# Patient Record
Sex: Female | Born: 1965
Health system: Southern US, Community
[De-identification: ages and names within clinical notes are randomized; demographics above are authoritative.]

## PROBLEM LIST (undated history)

## (undated) DIAGNOSIS — M1990 Unspecified osteoarthritis, unspecified site: Secondary | ICD-10-CM

## (undated) DIAGNOSIS — F32A Depression, unspecified: Secondary | ICD-10-CM

## (undated) DIAGNOSIS — E785 Hyperlipidemia, unspecified: Secondary | ICD-10-CM

## (undated) DIAGNOSIS — R87629 Unspecified abnormal cytological findings in specimens from vagina: Secondary | ICD-10-CM

## (undated) DIAGNOSIS — R519 Headache, unspecified: Secondary | ICD-10-CM

## (undated) DIAGNOSIS — F329 Major depressive disorder, single episode, unspecified: Secondary | ICD-10-CM

## (undated) DIAGNOSIS — R51 Headache: Secondary | ICD-10-CM

## (undated) DIAGNOSIS — T7840XA Allergy, unspecified, initial encounter: Secondary | ICD-10-CM

## (undated) DIAGNOSIS — E079 Disorder of thyroid, unspecified: Secondary | ICD-10-CM

## (undated) HISTORY — DX: Headache, unspecified: R51.9

## (undated) HISTORY — PX: WISDOM TOOTH EXTRACTION: SHX21

## (undated) HISTORY — DX: Hyperlipidemia, unspecified: E78.5

## (undated) HISTORY — DX: Unspecified osteoarthritis, unspecified site: M19.90

## (undated) HISTORY — DX: Disorder of thyroid, unspecified: E07.9

## (undated) HISTORY — DX: Allergy, unspecified, initial encounter: T78.40XA

## (undated) HISTORY — DX: Headache: R51

## (undated) HISTORY — PX: APPENDECTOMY: SHX54

## (undated) HISTORY — DX: Depression, unspecified: F32.A

## (undated) HISTORY — DX: Unspecified abnormal cytological findings in specimens from vagina: R87.629

## (undated) HISTORY — DX: Major depressive disorder, single episode, unspecified: F32.9

---

## 2016-01-21 DIAGNOSIS — F321 Major depressive disorder, single episode, moderate: Secondary | ICD-10-CM | POA: Insufficient documentation

## 2016-01-27 DIAGNOSIS — Z9189 Other specified personal risk factors, not elsewhere classified: Secondary | ICD-10-CM | POA: Insufficient documentation

## 2016-01-31 DIAGNOSIS — Z8639 Personal history of other endocrine, nutritional and metabolic disease: Secondary | ICD-10-CM | POA: Insufficient documentation

## 2017-03-20 DIAGNOSIS — E669 Obesity, unspecified: Secondary | ICD-10-CM | POA: Insufficient documentation

## 2017-03-20 DIAGNOSIS — E559 Vitamin D deficiency, unspecified: Secondary | ICD-10-CM | POA: Insufficient documentation

## 2017-03-20 DIAGNOSIS — R3589 Other polyuria: Secondary | ICD-10-CM | POA: Insufficient documentation

## 2017-06-14 LAB — HM MAMMOGRAPHY: HM Mammogram: NORMAL (ref 0–4)

## 2017-06-22 LAB — HM PAP SMEAR

## 2017-12-13 DIAGNOSIS — D229 Melanocytic nevi, unspecified: Secondary | ICD-10-CM | POA: Insufficient documentation

## 2018-05-08 ENCOUNTER — Encounter: Payer: Self-pay | Admitting: Family Medicine

## 2018-05-08 ENCOUNTER — Ambulatory Visit (INDEPENDENT_AMBULATORY_CARE_PROVIDER_SITE_OTHER): Payer: No Typology Code available for payment source | Admitting: Family Medicine

## 2018-05-08 VITALS — BP 106/76 | HR 81 | Temp 98.5°F | Resp 16 | Ht 67.25 in | Wt 198.0 lb

## 2018-05-08 DIAGNOSIS — M25542 Pain in joints of left hand: Secondary | ICD-10-CM

## 2018-05-08 DIAGNOSIS — Z23 Encounter for immunization: Secondary | ICD-10-CM | POA: Diagnosis not present

## 2018-05-08 DIAGNOSIS — E039 Hypothyroidism, unspecified: Secondary | ICD-10-CM

## 2018-05-08 DIAGNOSIS — Z114 Encounter for screening for human immunodeficiency virus [HIV]: Secondary | ICD-10-CM

## 2018-05-08 DIAGNOSIS — M25541 Pain in joints of right hand: Secondary | ICD-10-CM | POA: Diagnosis not present

## 2018-05-08 DIAGNOSIS — Z1211 Encounter for screening for malignant neoplasm of colon: Secondary | ICD-10-CM | POA: Diagnosis not present

## 2018-05-08 DIAGNOSIS — Z0001 Encounter for general adult medical examination with abnormal findings: Secondary | ICD-10-CM

## 2018-05-08 DIAGNOSIS — Z0000 Encounter for general adult medical examination without abnormal findings: Secondary | ICD-10-CM | POA: Diagnosis not present

## 2018-05-08 MED ORDER — MELOXICAM 15 MG PO TABS: 7.5000 mg | ORAL_TABLET | Freq: Every day | ORAL | 5 refills | Status: DC

## 2018-05-08 MED ORDER — LEVOTHYROXINE SODIUM 150 MCG PO TABS: 150.0000 ug | ORAL_TABLET | Freq: Every day | ORAL | 3 refills | Status: DC

## 2018-05-08 NOTE — Patient Instructions (Addendum)
Aim to do some physical exertion for 150 minutes per week. This is typically divided into 5 days per week, 30 minutes per day. The activity should be enough to get your heart rate up. Anything is better than nothing if you have time constraints.  Give [place] 2-3 business days to get the results of your labs back.   The new Shingrix vaccine (for shingles) is a 2 shot series. It can make people feel low energy, achy and almost like they have the flu for 48 hours after injection. Please plan accordingly when deciding on when to get this shot. Call our office for a nurse visit appointment to get this. The second shot of the series is less severe regarding the side effects, but it still lasts 48 hours.   Healthy Eating Plan Many factors influence your heart health, including eating and exercise habits. Heart (coronary) risk increases with abnormal blood fat (lipid) levels. Heart-healthy meal planning includes limiting unhealthy fats, increasing healthy fats, and making other small dietary changes. This includes maintaining a healthy body weight to help keep lipid levels within a normal range.  WHAT IS MY PLAN?  Your health care provider recommends that you:  Drink a glass of water before meals to help with satiety.  Eat slowly.  An alternative to the water is to add Metamucil. This will help with satiety as well. It does contain calories, unlike water.  WHAT TYPES OF FAT SHOULD I CHOOSE?  Choose healthy fats more often. Choose monounsaturated and polyunsaturated fats, such as olive oil and canola oil, flaxseeds, walnuts, almonds, and seeds.  Eat more omega-3 fats. Good choices include salmon, mackerel, sardines, tuna, flaxseed oil, and ground flaxseeds. Aim to eat fish at least two times each week.  Avoid foods with partially hydrogenated oils in them. These contain trans fats. Examples of foods that contain trans fats are stick margarine, some tub margarines, cookies, crackers, and other baked goods.  If you are going to avoid a fat, this is the one to avoid!  WHAT GENERAL GUIDELINES DO I NEED TO FOLLOW?  Check food labels carefully to identify foods with trans fats. Avoid these types of options when possible.  Fill one half of your plate with vegetables and green salads. Eat 4-5 servings of vegetables per day. A serving of vegetables equals 1 cup of raw leafy vegetables,  cup of raw or cooked cut-up vegetables, or  cup of vegetable juice.  Fill one fourth of your plate with whole grains. Look for the word "whole" as the first word in the ingredient list.  Fill one fourth of your plate with lean protein foods.  Eat 4-5 servings of fruit per day. A serving of fruit equals one medium whole fruit,  cup of dried fruit,  cup of fresh, frozen, or canned fruit. Try to avoid fruits in cups/syrups as the sugar content can be high.  Eat more foods that contain soluble fiber. Examples of foods that contain this type of fiber are apples, broccoli, carrots, beans, peas, and barley. Aim to get 20-30 g of fiber per day.  Eat more home-cooked food and less restaurant, buffet, and fast food.  Limit or avoid alcohol.  Limit foods that are high in starch and sugar.  Avoid fried foods when able.  Cook foods by using methods other than frying. Baking, boiling, grilling, and broiling are all great options. Other fat-reducing suggestions include: ? Removing the skin from poultry. ? Removing all visible fats from meats. ? Skimming the  fat off of stews, soups, and gravies before serving them. ? Steaming vegetables in water or broth.  Lose weight if you are overweight. Losing just 5-10% of your initial body weight can help your overall health and prevent diseases such as diabetes and heart disease.  Increase your consumption of nuts, legumes, and seeds to 4-5 servings per week. One serving of dried beans or legumes equals  cup after being cooked, one serving of nuts equals 1 ounces, and one serving of  seeds equals  ounce or 1 tablespoon.  WHAT ARE GOOD FOODS CAN I EAT? Grains Grainy breads (try to find bread that is 3 g of fiber per slice or greater), oatmeal, light popcorn. Whole-grain cereals. Rice and pasta, including brown rice and those that are made with whole wheat. Edamame pasta is a great alternative to grain pasta. It has a higher protein content. Try to avoid significant consumption of white bread, sugary cereals, or pastries/baked goods.  Vegetables All vegetables. Cooked white potatoes do not count as vegetables.  Fruits All fruits, but limit pineapple and bananas as these fruits have a higher sugar content.  Meats and Other Protein Sources Lean, well-trimmed beef, veal, pork, and lamb. Chicken and [place] without skin. All fish and shellfish. Wild duck, rabbit, pheasant, and venison. Egg whites or low-cholesterol egg substitutes. Dried beans, peas, lentils, and tofu.Seeds and most nuts.  Dairy Low-fat or nonfat cheeses, including ricotta, string, and mozzarella. Skim or 1% milk that is liquid, powdered, or evaporated. Buttermilk that is made with low-fat milk. Nonfat or low-fat yogurt. Soy/Almond milk are good alternatives if you cannot handle dairy.  Beverages Water is the best for you. Sports drinks with less sugar are more desirable unless you are a highly active athlete.  Sweets and Desserts Sherbets and fruit ices. Honey, jam, marmalade, jelly, and syrups. Dark chocolate.  Eat all sweets and desserts in moderation.  Fats and Oils Nonhydrogenated (trans-free) margarines. Vegetable oils, including soybean, sesame, sunflower, olive, peanut, safflower, corn, canola, and cottonseed. Salad dressings or mayonnaise that are made with a vegetable oil. Limit added fats and oils that you use for cooking, baking, salads, and as spreads.  Other Cocoa powder. Coffee and tea. Most condiments.  The items listed above may not be a complete list of recommended foods or beverages.  Contact your dietitian for more options.

## 2018-05-08 NOTE — Progress Notes (Signed)
CC: WC   Well Woman Sharon Bradford is here for a complete physical.   Her last physical was >1 year ago.  Current diet: in general, a "healthy" diet. Current exercise: walking dogs. Weight is stable and she denies daytime fatigue. No LMP recorded. Seatbelt? Yes  Patient has a family history of rheumatoid arthritis.  She has been having worsening pain in the PIP joints of both hands.  She is concerned she may have inflammatory arthritis and would like to be checked.  She does have a questionable history of Graves' disease.  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  Health Maintenance Pap/HPV- Yes 06/22/17 Mammogram- Yes  06/14/18 Tetanus- No Colonoscopy- No HIV screening- No  Flu- 05/03/18  Past Medical History:  Diagnosis Date  . Arthritis   . Depression   . Headache      Past Surgical History:  Procedure Laterality Date  . APPENDECTOMY      Medications  Current Outpatient Medications on File Prior to Visit  Medication Sig Dispense Refill  . Cholecalciferol (VITAMIN D3) 2000 units TABS Take 2,000 Units by mouth daily.    Marland Kitchen Loratadine-Pseudoephedrine (PX ALLERGY RELIEF D, LORATID, PO) Take 10 mg by mouth daily.      Allergies No Known Allergies  Review of Systems: Constitutional:  no unexpected weight changes Eye:  no recent significant change in vision Ear/Nose/Mouth/Throat:  Ears:  no tinnitus or vertigo and no recent change in hearing Nose/Mouth/Throat:  no complaints of nasal congestion, no sore throat Cardiovascular: no chest pain Respiratory:  no cough and no shortness of breath Gastrointestinal:  no abdominal pain, no change in bowel habits GU:  Female: negative for dysuria or pelvic pain Musculoskeletal/Extremities: +hand pain; otherwise no pain of the joints Integumentary (Skin/Breast):  no abnormal skin lesions reported Neurologic:  no headaches Endocrine:  denies fatigue Hematologic/Lymphatic:  No areas of easy bleeding  Exam BP  106/76   Pulse 81   Temp 98.5 F (36.9 C) (Oral)   Resp 16   Ht 5' 7.25" (1.708 m)   Wt 198 lb (89.8 kg)   SpO2 98%   BMI 30.78 kg/m  General:  well developed, well nourished, in no apparent distress Skin:  no significant moles, warts, or growths Head:  no masses, lesions, or tenderness Eyes:  pupils equal and round, sclera anicteric without injection Ears:  canals without lesions, TMs shiny without retraction, no obvious effusion, no erythema Nose:  nares patent, septum midline, mucosa normal, and no drainage or sinus tenderness Throat/Pharynx:  lips and gingiva without lesion; tongue and uvula midline; non-inflamed pharynx; no exudates or postnasal drainage Neck: neck supple without adenopathy, thyromegaly, or masses Lungs:  clear to auscultation, breath sounds equal bilaterally, no respiratory distress Cardio:  regular rate and rhythm, no bruits, no LE edema Abdomen:  abdomen soft, nontender; bowel sounds normal; no masses or organomegaly Genital: Defer to GYN Musculoskeletal: +thickening of joints of PIP's b/l hands; otherwise symmetrical muscle groups noted without atrophy or deformity Extremities:  no clubbing, cyanosis, or edema, no deformities, no skin discoloration Neuro:  gait normal; deep tendon reflexes normal and symmetric Psych: well oriented with normal range of affect and appropriate judgment/insight  Assessment and Plan  Well adult exam - Plan: Lipid panel, CBC, Comprehensive metabolic panel  Screen for colon cancer - Plan: Ambulatory referral to Gastroenterology  Encounter for screening for HIV - Plan: HIV Antibody (routine testing w rflx)  Hypothyroidism, unspecified type - Plan: levothyroxine (SYNTHROID, LEVOTHROID) 150 MCG  tablet, TSH, T4, free  Arthralgia of both hands - Plan: ANA,IFA RA Diag Pnl w/rflx Tit/Patn, Anti-DNA antibody, double-stranded, Anti-Maffei antibody  Need for Tdap vaccination - Plan: Tdap vaccine greater than or equal to 12yo IM   Well  52 y.o. female. Counseled on diet and exercise. Other orders as above. Follow up in 1 yr pending above. The patient voiced understanding and agreement to the plan.  [place], DO 05/08/18 5:05 PM

## 2018-05-09 ENCOUNTER — Encounter: Payer: Self-pay | Admitting: Gastroenterology

## 2018-05-09 LAB — CBC
HEMATOCRIT: 34.5 % — AB (ref 36.0–46.0)
Hemoglobin: 11.7 g/dL — ABNORMAL LOW (ref 12.0–15.0)
MCHC: 34 g/dL (ref 30.0–36.0)
MCV: 87.4 fl (ref 78.0–100.0)
Platelets: 199 10*3/uL (ref 150.0–400.0)
RBC: 3.95 Mil/uL (ref 3.87–5.11)
RDW: 13.7 % (ref 11.5–15.5)
WBC: 6.4 10*3/uL (ref 4.0–10.5)

## 2018-05-09 LAB — LIPID PANEL
CHOLESTEROL: 205 mg/dL — AB (ref 0–200)
HDL: 38.7 mg/dL — ABNORMAL LOW (ref >39.00)
Total CHOL/HDL Ratio: 5

## 2018-05-09 LAB — T4, FREE: FREE T4: 1.12 ng/dL (ref 0.60–1.60)

## 2018-05-09 LAB — COMPREHENSIVE METABOLIC PANEL
ALBUMIN: 4.2 g/dL (ref 3.5–5.2)
ALT: 16 U/L (ref 0–35)
AST: 14 U/L (ref 0–37)
Alkaline Phosphatase: 47 U/L (ref 39–117)
BUN: 20 mg/dL (ref 6–23)
CHLORIDE: 104 meq/L (ref 96–112)
CO2: 25 meq/L (ref 19–32)
CREATININE: 0.81 mg/dL (ref 0.40–1.20)
Calcium: 9.3 mg/dL (ref 8.4–10.5)
GFR: 79.31 mL/min (ref >60.00)
Glucose, Bld: 90 mg/dL (ref 70–99)
POTASSIUM: 4.3 meq/L (ref 3.5–5.1)
SODIUM: 138 meq/L (ref 135–145)
Total Bilirubin: 0.8 mg/dL (ref 0.2–1.2)
Total Protein: 6.8 g/dL (ref 6.0–8.3)

## 2018-05-09 LAB — LDL CHOLESTEROL, DIRECT: LDL DIRECT: 101 mg/dL

## 2018-05-09 LAB — TSH: TSH: 0.46 u[IU]/mL (ref 0.35–4.50)

## 2018-05-09 MED FILL — LEVOTHYROXINE 150 MCG TAB: 150 | 90 days supply | Qty: 90 | Fill #0

## 2018-05-09 MED FILL — MELOXICAM 15 MG TABLET: 15 | 30 days supply | Qty: 30 | Fill #0

## 2018-05-10 LAB — ANA,IFA RA DIAG PNL W/RFLX TIT/PATN
Anti Nuclear Antibody(ANA): NEGATIVE
Rhuematoid fact SerPl-aCnc: <14 IU/mL (ref <14)

## 2018-05-10 LAB — ANTI-SMITH ANTIBODY: ENA SM AB SER-ACNC: NEGATIVE AI

## 2018-05-10 LAB — ANTI-DNA ANTIBODY, DOUBLE-STRANDED: DS DNA AB: 5 [IU]/mL — AB

## 2018-05-10 LAB — HIV ANTIBODY (ROUTINE TESTING W REFLEX): HIV 1&2 Ab, 4th Generation: NONREACTIVE

## 2018-05-31 ENCOUNTER — Ambulatory Visit (AMBULATORY_SURGERY_CENTER): Payer: Self-pay | Admitting: *Deleted

## 2018-05-31 VITALS — Ht 67.0 in | Wt 196.0 lb

## 2018-05-31 DIAGNOSIS — Z1211 Encounter for screening for malignant neoplasm of colon: Secondary | ICD-10-CM

## 2018-05-31 MED ORDER — NA SULFATE-K SULFATE-MG SULF 17.5-3.13-1.6 GM/177ML PO SOLN: 1.0000 | Freq: Once | ORAL | 0 refills | Status: AC

## 2018-05-31 MED FILL — SUPREP BOWEL PREP KIT: 17.5-3.13-1 | 1 days supply | Qty: 354 | Fill #0

## 2018-05-31 NOTE — Progress Notes (Signed)
No egg or soy allergy known to patient  No issues with past sedation with any surgeries  or procedures, no intubation problems  No diet pills per patient No home 02 use per patient  No blood thinners per patient  Pt denies issues with constipation  No A fib or A flutter  EMMI video sent to pt's e mail pt declined   

## 2018-06-06 ENCOUNTER — Encounter: Payer: Self-pay | Admitting: Gastroenterology

## 2018-06-20 ENCOUNTER — Ambulatory Visit (AMBULATORY_SURGERY_CENTER): Payer: No Typology Code available for payment source | Admitting: Gastroenterology

## 2018-06-20 ENCOUNTER — Encounter: Payer: Self-pay | Admitting: Gastroenterology

## 2018-06-20 VITALS — BP 117/72 | HR 55 | Temp 97.8°F | Resp 16 | Ht 67.0 in | Wt 196.0 lb

## 2018-06-20 DIAGNOSIS — Z1211 Encounter for screening for malignant neoplasm of colon: Secondary | ICD-10-CM

## 2018-06-20 DIAGNOSIS — D127 Benign neoplasm of rectosigmoid junction: Secondary | ICD-10-CM

## 2018-06-20 MED ORDER — SODIUM CHLORIDE 0.9 % IV SOLN: 500.0000 mL | Freq: Once | INTRAVENOUS | Status: DC

## 2018-06-20 NOTE — Progress Notes (Signed)
Called to room to assist during endoscopic procedure.  Patient ID and intended procedure confirmed with present staff. Received instructions for my participation in the procedure from the performing physician.  

## 2018-06-20 NOTE — Progress Notes (Signed)
To PACU, VSS. Report to [email or web address].tb 

## 2018-06-20 NOTE — Op Note (Signed)
Sharon Bradford: Kevin Mario Procedure Date: 06/20/2018 9:36 AM MRN: [id number] Endoscopist: Jackquline Denmark , MD Age: 52 Referring MD:  Date of Birth: Dec 10, 1965 Gender: Female Account #: [id number] Procedure:                Colonoscopy Indications:              Screening for colorectal malignant neoplasm Medicines:                Monitored Anesthesia Care Procedure:                Pre-Anesthesia Assessment:                           - Prior to the procedure, a History and Physical                            was performed, and patient medications and                            allergies were reviewed. The patient's tolerance of                            previous anesthesia was also reviewed. The risks                            and benefits of the procedure and the sedation                            options and risks were discussed with the patient.                            All questions were answered, and informed consent                            was obtained. Prior Anticoagulants: The patient has                            taken no previous anticoagulant or antiplatelet                            agents. ASA Grade Assessment: I - A normal, healthy                            patient. After reviewing the risks and benefits,                            the patient was deemed in satisfactory condition to                            undergo the procedure.                           After obtaining informed consent, the colonoscope  was passed under direct vision. Throughout the                            procedure, the patient's blood pressure, pulse, and                            oxygen saturations were monitored continuously. The                            Colonoscope was introduced through the anus and                            advanced to the 2 cm into the ileum. The                            colonoscopy was performed without  difficulty. The                            patient tolerated the procedure well. The quality                            of the bowel preparation was excellent. The colon                            was highly redundant. Scope In: 9:44:38 AM Scope Out: 10:00:03 AM Scope Withdrawal Time: 0 hours 9 minutes 52 seconds  Total Procedure Duration: 0 hours 15 minutes 25 seconds  Findings:                 A 10 mm polyp was found in the recto-sigmoid colon,                            15 cm from the anal verge. The polyp was                            pedunculated. The polyp was removed with a hot                            snare. Resection and retrieval were complete.                            Estimated blood loss: none.                           Non-bleeding internal hemorrhoids were found during                            retroflexion. The hemorrhoids were small.                           The exam was otherwise without abnormality on                            direct and retroflexion views. Complications:  No immediate complications. Estimated Blood Loss:     Estimated blood loss: none. Impression:               -Colonic polyp status post polypectomy                           -Small internal hemorrhoids.                           -Otherwise normal colonoscopy to TI. Recommendation:           - Patient has a contact number available for                            emergencies. The signs and symptoms of potential                            delayed complications were discussed with the                            patient. Return to normal activities tomorrow.                            Written discharge instructions were provided to the                            patient.                           - Resume previous diet.                           - Continue present medications.                           - Await pathology results.                           - Repeat colonoscopy for  surveillance based on                            pathology results. Jackquline Denmark, MD 06/20/2018 10:05:06 AM This report has been signed electronically.

## 2018-06-20 NOTE — Patient Instructions (Signed)
Polyp information given.   YOU HAD AN ENDOSCOPIC PROCEDURE TODAY AT THE [facility]:   Refer to the procedure report that was given to you for any specific questions about what was found during the examination.  If the procedure report does not answer your questions, please call your gastroenterologist to clarify.  If you requested that your care partner not be given the details of your procedure findings, then the procedure report has been included in a sealed envelope for you to review at your convenience later.  YOU SHOULD EXPECT: Some feelings of bloating in the abdomen. Passage of more gas than usual.  Walking can help get rid of the air that was put into your GI tract during the procedure and reduce the bloating. If you had a lower endoscopy (such as a colonoscopy or flexible sigmoidoscopy) you may notice spotting of blood in your stool or on the toilet paper. If you underwent a bowel prep for your procedure, you may not have a normal bowel movement for a few days.  Please Note:  You might notice some irritation and congestion in your nose or some drainage.  This is from the oxygen used during your procedure.  There is no need for concern and it should clear up in a day or so.  SYMPTOMS TO REPORT IMMEDIATELY:   Following lower endoscopy (colonoscopy or flexible sigmoidoscopy):  Excessive amounts of blood in the stool  Significant tenderness or worsening of abdominal pains  Swelling of the abdomen that is new, acute  Fever of 100F or higher   For urgent or emergent issues, a gastroenterologist can be reached at any hour by calling [phone].   DIET:  We do recommend a small meal at first, but then you may proceed to your regular diet.  Drink plenty of fluids but you should avoid alcoholic beverages for 24 hours.  ACTIVITY:  You should plan to take it easy for the rest of today and you should NOT DRIVE or use heavy machinery until tomorrow (because of the sedation  medicines used during the test).    FOLLOW UP: Our staff will call the number listed on your records the next business day following your procedure to check on you and address any questions or concerns that you may have regarding the information given to you following your procedure. If we do not reach you, we will leave a message.  However, if you are feeling well and you are not experiencing any problems, there is no need to return our call.  We will assume that you have returned to your regular daily activities without incident.  If any biopsies were taken you will be contacted by phone or by letter within the next 1-3 weeks.  Please call us at [phone] if you have not heard about the biopsies in 3 weeks.    SIGNATURES/CONFIDENTIALITY: You and/or your care partner have signed paperwork which will be entered into your electronic medical record.  These signatures attest to the fact that that the information above on your After Visit Summary has been reviewed and is understood.  Full responsibility of the confidentiality of this discharge information lies with you and/or your care-partner. 

## 2018-06-21 ENCOUNTER — Telehealth: Payer: Self-pay

## 2018-06-21 NOTE — Telephone Encounter (Signed)
  Follow up Call-  Call back number 06/20/2018  Post procedure Call Back phone  # [phone] - please call after 8:30am  Permission to leave phone message Yes     Patient questions:  Do you have a fever, pain , or abdominal swelling? No. Pain Score  0 *  Have you tolerated food without any problems? Yes.    Have you been able to return to your normal activities? Yes.    Do you have any questions about your discharge instructions: Diet   No. Medications  No. Follow up visit  No.  Do you have questions or concerns about your Care? No.  Actions: * If pain score is 4 or above: No action needed, pain <4.

## 2018-06-29 ENCOUNTER — Encounter: Payer: Self-pay | Admitting: Gastroenterology

## 2018-07-10 ENCOUNTER — Telehealth: Payer: Self-pay | Admitting: Family Medicine

## 2018-07-10 NOTE — Telephone Encounter (Signed)
Copied from [place] [id number]. Topic: Referral - Request for Referral >> Jul 10, 2018  4:32 PM Blase Mess A wrote: Has patient seen PCP for this complaint? No-Yes Insurance is requiring *If NO, is insurance requiring patient see PCP for this issue before PCP can refer them? Referral for which specialty-GYN Preferred provider/office: [facility] Reason for referral: Insurance-[employer] ok calling to schedule appt

## 2018-07-10 NOTE — Telephone Encounter (Signed)
Two messages were sent on this patient and another(that was not our patient), with the exact same message and exact time. Please clarify which patient this message actually belongs to.

## 2018-07-23 MED FILL — LEVOTHYROXINE 150 MCG TAB: 150 | 90 days supply | Qty: 90 | Fill #1

## 2018-07-23 MED FILL — MELOXICAM 15 MG TABLET: 15 | 30 days supply | Qty: 30 | Fill #1

## 2018-09-04 ENCOUNTER — Encounter: Payer: Self-pay | Admitting: Family Medicine

## 2018-09-04 ENCOUNTER — Ambulatory Visit (INDEPENDENT_AMBULATORY_CARE_PROVIDER_SITE_OTHER): Payer: No Typology Code available for payment source | Admitting: Family Medicine

## 2018-09-04 VITALS — BP 112/78 | HR 77 | Temp 98.5°F | Ht 67.5 in | Wt 195.5 lb

## 2018-09-04 DIAGNOSIS — R110 Nausea: Secondary | ICD-10-CM | POA: Diagnosis not present

## 2018-09-04 DIAGNOSIS — R109 Unspecified abdominal pain: Secondary | ICD-10-CM | POA: Diagnosis not present

## 2018-09-04 DIAGNOSIS — N390 Urinary tract infection, site not specified: Secondary | ICD-10-CM | POA: Diagnosis not present

## 2018-09-04 LAB — POCT INFLUENZA A/B
INFLUENZA B, POC: NEGATIVE
Influenza A, POC: NEGATIVE

## 2018-09-04 LAB — POC URINALSYSI DIPSTICK (AUTOMATED)
Bilirubin, UA: NEGATIVE
GLUCOSE UA: NEGATIVE
Ketones, UA: NEGATIVE
LEUKOCYTES UA: NEGATIVE
NITRITE UA: NEGATIVE
PH UA: 6 (ref 5.0–8.0)
Protein, UA: NEGATIVE
RBC UA: NEGATIVE
Spec Grav, UA: 1.015 (ref 1.010–1.025)
UROBILINOGEN UA: 0.2 U/dL

## 2018-09-04 MED ORDER — DICYCLOMINE HCL 10 MG PO CAPS: 10.0000 mg | ORAL_CAPSULE | Freq: Three times a day (TID) | ORAL | 0 refills | Status: DC

## 2018-09-04 MED ORDER — CEFTRIAXONE SODIUM 1 G IJ SOLR
1.0000 g | Freq: Once | INTRAMUSCULAR | Status: AC
  Administered 2018-09-04: 1 g via INTRAMUSCULAR

## 2018-09-04 MED ORDER — SULFAMETHOXAZOLE-TRIMETHOPRIM 800-160 MG PO TABS: 1.0000 | ORAL_TABLET | Freq: Two times a day (BID) | ORAL | 0 refills | Status: AC

## 2018-09-04 MED ORDER — ONDANSETRON HCL 4 MG PO TABS: 4.0000 mg | ORAL_TABLET | Freq: Three times a day (TID) | ORAL | 0 refills | Status: DC | PRN

## 2018-09-04 MED FILL — SULFAMETHOXAZOLE-TMP DS TAB: 800-160 | 7 days supply | Qty: 14 | Fill #0

## 2018-09-04 MED FILL — DICYCLOMINE 10 MG CAPSULE: 10 | 7 days supply | Qty: 30 | Fill #0

## 2018-09-04 MED FILL — ONDANSETRON HCL 4 MG TABLET: 4 | 7 days supply | Qty: 20 | Fill #0

## 2018-09-04 NOTE — Progress Notes (Signed)
Chief Complaint  Patient presents with  . Diarrhea  . Nausea  . Generalized Body Aches     Subjective Sharon Bradford is a 53 y.o. female who presents with vomiting and diarrhea Symptoms began yesterday Patient has cramping, myalgias, mild cough, nasal congestion/pressure, burning with urination and nausea Patient denies vomiting, diarrhea and fever Evaluation to date: Phenergan, Baclofen Sick contacts: grandson has GI illness No recent abx use.   Past Medical History:  Diagnosis Date  . Allergy   . Arthritis   . Depression   . Headache   . Hyperlipidemia    little elevated but no medicines   . Thyroid disease    Past Surgical History:  Procedure Laterality Date  . APPENDECTOMY    . WISDOM TOOTH EXTRACTION     no sedation    Current Outpatient Medications on File Prior to Visit  Medication Sig Dispense Refill  . Cholecalciferol (VITAMIN D3) 2000 units TABS Take 2,000 Units by mouth daily.    Marland Kitchen levothyroxine (SYNTHROID, LEVOTHROID) 150 MCG tablet Take 1 tablet (150 mcg total) by mouth daily before breakfast. 90 tablet 3  . Loratadine-Pseudoephedrine (PX ALLERGY RELIEF D, LORATID, PO) Take 10 mg by mouth daily.    . meloxicam (MOBIC) 15 MG tablet Take 0.5-1 tablets (7.5-15 mg total) by mouth daily. 30 tablet 5   Allergies  Allergen Reactions  . Codeine Itching    Review of Systems Constitutional:  No fevers or chills Ear/Nose/Mouth/Throat:  No red eyes Gastrointestinal:  As noted in the HPI Integumentary (Skin/Breast): no rash  Exam BP 112/78 (BP Location: Left Arm, Patient Position: Sitting, Cuff Size: Normal)   Pulse 77   Temp 98.5 F (36.9 C) (Oral)   Ht 5' 7.5" (1.715 m)   Wt 195 lb 8 oz (88.7 kg)   SpO2 98%   BMI 30.17 kg/m  General:  well developed, well hydrated, in no apparent distress HEENT: Ears neg, nares neg, no sinus ttp Skin:  warm, no pallor or diaphoresis, no rashes Throat/Pharynx:  lips and gingiva without lesion; tongue and uvula  midline; non-inflamed pharynx; no exudates or postnasal drainage Neck: neck supple without adenopathy, thyromegaly, or masses Lungs:  clear to auscultation, breath sounds equal bilaterally, no respiratory distress, no wheezes Cardio:  regular rate and rhythm without murmurs Abdomen:  abdomen soft, ttp diffusely, worse in lower quadrants; bowel sounds normal; no masses or organomegaly Extremities:  no clubbing, cyanosis, or edema Psych: Appropriate judgement/insight  Assessment and Plan  Complicated UTI (urinary tract infection) - Plan: sulfamethoxazole-trimethoprim (BACTRIM DS,SEPTRA DS) 800-160 MG tablet  Abdominal cramping - Plan: dicyclomine (BENTYL) 10 MG capsule  Nausea - Plan: ondansetron (ZOFRAN) 4 MG tablet  Cover for early pyelonephritis, await culture. If neg will stop abx.  Symptomatic management also.  F/u if symptoms fail to improve, sooner if worsening. The patient voiced understanding and agreement to the plan.  [place], DO 09/04/18  12:00 PM

## 2018-09-04 NOTE — Patient Instructions (Addendum)
Continue to push fluids, practice good hand hygiene, and cover your mouth if you cough.  If you start having fevers, shaking or shortness of breath, seek immediate care.  We should have the culture back by Friday and will let you know if there are any changes. No news means no change though.  Start Bactrim tomorrow since you are getting an antibiotic shot today.  Let us know if you need anything.

## 2018-09-04 NOTE — Addendum Note (Signed)
Addended by: Sharon Seller B on: 09/04/2018 12:09 PM   Modules accepted: Orders

## 2018-09-04 NOTE — Progress Notes (Signed)
Pre visit review using our clinic review tool, if applicable. No additional management support is needed unless otherwise documented below in the visit note. 

## 2018-09-05 LAB — URINE CULTURE
MICRO NUMBER:: 88911
SPECIMEN QUALITY:: ADEQUATE

## 2018-10-02 MED FILL — MELOXICAM 15 MG TABLET: 15 | 30 days supply | Qty: 30 | Fill #2

## 2018-10-28 MED FILL — LEVOTHYROXINE 150 MCG TAB: 150 | 90 days supply | Qty: 90 | Fill #2

## 2018-10-28 MED FILL — MELOXICAM 15 MG TABLET: 15 | 30 days supply | Qty: 30 | Fill #3 | Status: TO

## 2018-11-12 ENCOUNTER — Encounter: Payer: Self-pay | Admitting: Family Medicine

## 2018-11-13 ENCOUNTER — Encounter: Payer: Self-pay | Admitting: Family Medicine

## 2018-11-14 ENCOUNTER — Encounter: Payer: Self-pay | Admitting: Family Medicine

## 2018-11-15 NOTE — Telephone Encounter (Signed)
COPY & PASTED to 11/14/18 message/SLS

## 2018-11-15 NOTE — Telephone Encounter (Signed)
Called and spoke with patient about FMLA for her daughter's care to get first date out of work and RTW date; per pt, today is first date out and will be continuous for [8] weeks. Informed her Dr. Nani Ravens will review and sign-off on paperwork today or Monday and then I will fax to [employer]. I will send her a return message via My [employer] when paperwork has successfully been sent/SLS 04/03

## 2018-11-15 NOTE — Telephone Encounter (Addendum)
Conversation: Non-Urgent Medical Question  (Newest Message First)  November 13, 2018  Ewing, Donell Sievert, CMA  to Cason, Dabney        8:06 AM  That's fine.    Last read by Malachy Moan at 8:44 AM on 11/15/2018.  November 12, 2018  Shelda Pal, DO  to Ewing, Donell Sievert, CMA        4:46 PM  That's fine.       1:10 PM  Ewing, Donell Sievert, CMA routed this conversation to Shelda Pal, DO        12:43 PM  Luberta Mutter, Consuello Bossier, CMA routed this conversation to Shelda Pal, DO . Ewing, Donell Sievert, CMA  Malachy Moan  to Shelda Pal, DO        11:33 AM  Dr. Nani Ravens, my daughter, Rosmery Duggin (also one of your new patients) was hospitalized on 11/07/18 for a drug overdose. She is saying it was not a suicide attempt, but I found an undated note. she was transferred from [facility] to [place]. They are discharging her today. They recommend intensive outpatient counseling, psychiatric counseling, and continued DBT therapy. I can not get her to all of these appointments and continue to work full time. I am also experiencing some extreme sleeplessness, anxiety and palpitations (due to anxiety of leaving her alone).   I am wondering if you would approve FMLA so that I can care for my daughter according to the guidelines set up by [facility] and so that I can care for myself during these extremely stressful times.   You can reach me by phone if that is easiest, [phone].

## 2018-11-15 NOTE — Telephone Encounter (Signed)
COPY & PASTED into 11/14/18 message/SLS

## 2018-11-15 NOTE — Telephone Encounter (Signed)
Conversation: Visit Follow-Up Question  (Newest Message First)  November 15, 2018  Me       1:00 PM  Note    COPY & PASTED into 11/14/18 message/SLS    November 13, 2018        11:50 AM  Damita Dunnings, CMA routed this conversation to [place], Crosby Oyster, DO  Malachy Moan  to Shelda Pal, DO   a     11:33 AM  Dr. Nani Ravens, thank you for approving my FMLA. The intensive outpatient program for my daughter is scheduled to start tomorrow. I have requested 8 weeks off so that I can take her to this program and her other psychiatric and counseling appointments. The [employer] company should be faxing the appropriate paperwork to you today. Thank you for taking the time to fill it out and sent it back.   I appreciate your help and hope you are able to stay well.  -Sharon Bradford

## 2018-12-16 ENCOUNTER — Encounter: Payer: Self-pay | Admitting: Family Medicine

## 2018-12-17 ENCOUNTER — Encounter: Payer: Self-pay | Admitting: Family Medicine

## 2018-12-23 MED FILL — MELOXICAM 15 MG TABLET: 15 | 30 days supply | Qty: 30 | Fill #0

## 2019-02-24 MED FILL — LEVOTHYROXINE 150 MCG TAB: 150 | 90 days supply | Qty: 90 | Fill #3

## 2019-02-24 MED FILL — MELOXICAM 15 MG TABLET: 15 | 30 days supply | Qty: 30 | Fill #0

## 2019-05-26 ENCOUNTER — Other Ambulatory Visit: Payer: Self-pay

## 2019-05-27 ENCOUNTER — Ambulatory Visit (INDEPENDENT_AMBULATORY_CARE_PROVIDER_SITE_OTHER): Payer: No Typology Code available for payment source | Admitting: Family Medicine

## 2019-05-27 ENCOUNTER — Other Ambulatory Visit: Payer: Self-pay

## 2019-05-27 ENCOUNTER — Encounter: Payer: Self-pay | Admitting: Family Medicine

## 2019-05-27 VITALS — BP 112/72 | HR 80 | Temp 97.6°F | Ht 68.0 in | Wt 196.0 lb

## 2019-05-27 DIAGNOSIS — R002 Palpitations: Secondary | ICD-10-CM | POA: Diagnosis not present

## 2019-05-27 DIAGNOSIS — E039 Hypothyroidism, unspecified: Secondary | ICD-10-CM

## 2019-05-27 DIAGNOSIS — Z0000 Encounter for general adult medical examination without abnormal findings: Secondary | ICD-10-CM

## 2019-05-27 LAB — TSH: TSH: 1.64 u[IU]/mL (ref 0.35–4.50)

## 2019-05-27 LAB — COMPREHENSIVE METABOLIC PANEL
ALT: 16 U/L (ref 0–35)
AST: 19 U/L (ref 0–37)
Albumin: 4.5 g/dL (ref 3.5–5.2)
Alkaline Phosphatase: 50 U/L (ref 39–117)
BUN: 12 mg/dL (ref 6–23)
CO2: 26 mEq/L (ref 19–32)
Calcium: 9.5 mg/dL (ref 8.4–10.5)
Chloride: 102 mEq/L (ref 96–112)
Creatinine, Ser: 0.97 mg/dL (ref 0.40–1.20)
GFR: 60.36 mL/min (ref >60.00)
Glucose, Bld: 83 mg/dL (ref 70–99)
Potassium: 4.6 mEq/L (ref 3.5–5.1)
Sodium: 136 mEq/L (ref 135–145)
Total Bilirubin: 0.8 mg/dL (ref 0.2–1.2)
Total Protein: 7.3 g/dL (ref 6.0–8.3)

## 2019-05-27 LAB — CBC
HCT: 36.6 % (ref 36.0–46.0)
Hemoglobin: 12.3 g/dL (ref 12.0–15.0)
MCHC: 33.6 g/dL (ref 30.0–36.0)
MCV: 87.2 fl (ref 78.0–100.0)
Platelets: 205 10*3/uL (ref 150.0–400.0)
RBC: 4.2 Mil/uL (ref 3.87–5.11)
RDW: 13.6 % (ref 11.5–15.5)
WBC: 6.9 10*3/uL (ref 4.0–10.5)

## 2019-05-27 LAB — LIPID PANEL
Cholesterol: 211 mg/dL — ABNORMAL HIGH (ref 0–200)
HDL: 49.7 mg/dL (ref >39.00)
NonHDL: 161.52
Total CHOL/HDL Ratio: 4
Triglycerides: 239 mg/dL — ABNORMAL HIGH (ref 0.0–149.0)
VLDL: 47.8 mg/dL — ABNORMAL HIGH (ref 0.0–40.0)

## 2019-05-27 LAB — T4, FREE: Free T4: 1 ng/dL (ref 0.60–1.60)

## 2019-05-27 LAB — LDL CHOLESTEROL, DIRECT: Direct LDL: 105 mg/dL

## 2019-05-27 MED ORDER — MELOXICAM 15 MG PO TABS: 7.5000 mg | ORAL_TABLET | Freq: Every day | ORAL | 3 refills | Status: AC

## 2019-05-27 MED ORDER — LEVOTHYROXINE SODIUM 150 MCG PO TABS: 150.0000 ug | ORAL_TABLET | Freq: Every day | ORAL | 3 refills | Status: DC

## 2019-05-27 MED FILL — MELOXICAM 15 MG TABLET: 15 | 90 days supply | Qty: 90 | Fill #0

## 2019-05-27 MED FILL — LEVOTHYROXINE 150 MCG TAB: 150 | 90 days supply | Qty: 90 | Fill #0

## 2019-05-27 NOTE — Progress Notes (Signed)
CC: Well visit  Well Woman Sharon Bradford is here for a complete physical.   Her last physical was >1 year ago.  Current diet: in general, a "not the best" diet. Current exercise: walking. Weight is stable and she denies daytime fatigue. No LMP recorded. (Menstrual status: Other)..  Seatbelt? Yes  Over past several mo, has been having intermittent palpitations lasting 1-2 min. Dad has hx of a fib. She notes she is usually more stressed during this time. It will happen sometimes at rest. No lightheadedness, syncope, cp, or sob. No excessive caffeine/alcohol intake. Stays hydrated.   Health Maintenance Pap/HPV- Yes Mammogram- Yes Tetanus- Yes HIV screening- Yes  Past Medical History:  Diagnosis Date  . Allergy   . Arthritis   . Depression   . Headache   . Hyperlipidemia    little elevated but no medicines   . Thyroid disease      Past Surgical History:  Procedure Laterality Date  . APPENDECTOMY    . WISDOM TOOTH EXTRACTION     no sedation     Medications  Current Outpatient Medications on File Prior to Visit  Medication Sig Dispense Refill  . Cholecalciferol (VITAMIN D3) 2000 units TABS Take 2,000 Units by mouth daily.      Allergies Allergies  Allergen Reactions  . Codeine Itching    Review of Systems: Constitutional:  no unexpected weight changes Eye:  no recent significant change in vision Ear/Nose/Mouth/Throat:  Ears:  no tinnitus or vertigo and no recent change in hearing Nose/Mouth/Throat:  no complaints of nasal congestion, no sore throat Cardiovascular: no chest pain Respiratory:  no cough and no shortness of breath Gastrointestinal:  no abdominal pain, no change in bowel habits GU:  Female: negative for dysuria or pelvic pain Musculoskeletal/Extremities:  no pain of the joints Integumentary (Skin/Breast):  no abnormal skin lesions reported Neurologic:  No current  headaches Endocrine:  denies fatigue Hematologic/Lymphatic:  No areas of easy  bleeding  Exam BP 112/72 (BP Location: Left Arm, Patient Position: Sitting, Cuff Size: Normal)   Pulse 80   Temp 97.6 F (36.4 C) (Temporal)   Ht 5\' 8"  (1.727 m)   Wt 196 lb (88.9 kg)   SpO2 96%   BMI 29.80 kg/m  General:  well developed, well nourished, in no apparent distress Skin:  no significant moles, warts, or growths Head:  no masses, lesions, or tenderness Eyes:  pupils equal and round, sclera anicteric without injection Ears:  canals without lesions, TMs shiny without retraction, no obvious effusion, no erythema Nose:  nares patent, septum midline, mucosa normal, and no drainage or sinus tenderness Throat/Pharynx:  lips and gingiva without lesion; tongue and uvula midline; non-inflamed pharynx; no exudates or postnasal drainage Neck: neck supple without adenopathy, thyromegaly, or masses Lungs:  clear to auscultation, breath sounds equal bilaterally, no respiratory distress Cardio:  regular rate and rhythm, no bruits, no LE edema Abdomen:  abdomen soft, nontender; bowel sounds normal; no masses or organomegaly Genital: Defer to GYN Musculoskeletal:  symmetrical muscle groups noted without atrophy or deformity Extremities:  no clubbing, cyanosis, or edema, no deformities, no skin discoloration Neuro:  gait normal; deep tendon reflexes normal and symmetric Psych: well oriented with normal range of affect and appropriate judgment/insight  Assessment and Plan  Well adult exam - Plan: CBC, Comprehensive metabolic panel, Lipid panel  Hypothyroidism, unspecified type - Plan: levothyroxine (SYNTHROID) 150 MCG tablet, TSH, T4, free  Palpitations   Well 53 y.o. female. Counseled on diet and  exercise. Other orders as above. Ck labs, if nml, will order 72 hr Holter.  Follow up in 1 yr pending above. The patient voiced understanding and agreement to the plan.  [place], DO 05/27/19 2:24 PM

## 2019-05-27 NOTE — Patient Instructions (Signed)
Give [place] 2-3 business days to get the results of your labs back. If labs are normal, I will be ordering a 72 hour Holter monitor.   Keep the diet clean and stay active.  Let us know if you need anything.

## 2019-05-28 ENCOUNTER — Other Ambulatory Visit: Payer: Self-pay | Admitting: Family Medicine

## 2019-05-28 DIAGNOSIS — E785 Hyperlipidemia, unspecified: Secondary | ICD-10-CM

## 2019-08-17 ENCOUNTER — Telehealth: Payer: No Typology Code available for payment source

## 2019-08-18 MED FILL — ACETAMINOPHEN/COD #3 TABLET: 300-30 | 3 days supply | Qty: 12 | Fill #0

## 2019-08-18 MED FILL — AMOXICILLIN 500 MG CAPSULE: 500 | 10 days supply | Qty: 40 | Fill #0

## 2019-08-19 MED FILL — traMADol HCL 50 MG TABS: 50 | 3 days supply | Qty: 10 | Fill #0

## 2019-08-26 ENCOUNTER — Encounter: Payer: Self-pay | Admitting: Family Medicine

## 2019-08-27 ENCOUNTER — Other Ambulatory Visit: Payer: Self-pay | Admitting: Family Medicine

## 2019-08-27 DIAGNOSIS — R002 Palpitations: Secondary | ICD-10-CM

## 2019-08-27 MED FILL — LEVOTHYROXINE SODIUM 150 MC: 150 | 90 days supply | Qty: 90 | Fill #1

## 2019-08-27 MED FILL — MELOXICAM 15 MG TABLET: 15 | 90 days supply | Qty: 90 | Fill #1

## 2019-08-29 ENCOUNTER — Other Ambulatory Visit: Payer: Self-pay | Admitting: Family Medicine

## 2019-08-29 DIAGNOSIS — E039 Hypothyroidism, unspecified: Secondary | ICD-10-CM

## 2019-08-29 MED ORDER — LEVOTHYROXINE SODIUM 150 MCG PO TABS: 150.0000 ug | ORAL_TABLET | Freq: Every day | ORAL | 3 refills | Status: AC

## 2019-09-02 ENCOUNTER — Telehealth: Payer: Self-pay | Admitting: Family Medicine

## 2019-09-02 NOTE — Telephone Encounter (Signed)
Have left two messages asking for the pt to return call to schedule monitor being put on in heartcare office. Order in [place].

## 2019-09-03 ENCOUNTER — Other Ambulatory Visit: Payer: Self-pay | Admitting: Family Medicine

## 2019-09-03 DIAGNOSIS — R002 Palpitations: Secondary | ICD-10-CM

## 2019-09-03 NOTE — Telephone Encounter (Signed)
Left third message for pt to return call to schedule 72 hour monitor/pp

## 2019-09-05 ENCOUNTER — Ambulatory Visit (INDEPENDENT_AMBULATORY_CARE_PROVIDER_SITE_OTHER): Payer: No Typology Code available for payment source | Admitting: Cardiology

## 2019-09-05 ENCOUNTER — Ambulatory Visit (INDEPENDENT_AMBULATORY_CARE_PROVIDER_SITE_OTHER): Payer: No Typology Code available for payment source

## 2019-09-05 ENCOUNTER — Encounter: Payer: Self-pay | Admitting: Cardiology

## 2019-09-05 ENCOUNTER — Other Ambulatory Visit: Payer: Self-pay

## 2019-09-05 DIAGNOSIS — R002 Palpitations: Secondary | ICD-10-CM | POA: Insufficient documentation

## 2019-09-05 DIAGNOSIS — R011 Cardiac murmur, unspecified: Secondary | ICD-10-CM

## 2019-09-05 NOTE — Patient Instructions (Signed)
Medication Instructions:  Your physician recommends that you continue on your current medications as directed. Please refer to the Current Medication list given to you today.  *If you need a refill on your cardiac medications before your next appointment, please call your pharmacy*  Lab Work: NONE If you have labs (blood work) drawn today and your tests are completely normal, you will receive your results only by: Marland Kitchen MyChart Message (if you have MyChart) OR . A paper copy in the mail If you have any lab test that is abnormal or we need to change your treatment, we will call you to review the results.  Testing/Procedures: Your physician has requested that you have an echocardiogram. Echocardiography is a painless test that uses sound waves to create images of your heart. It provides your doctor with information about the size and shape of your heart and how well your heart's chambers and valves are working. This procedure takes approximately one hour. There are no restrictions for this procedure.  Your physician has recommended that you wear a ZIO monitor. ZIo monitors are medical devices that record the heart's electrical activity. Doctors most often use these monitors to diagnose arrhythmias. Arrhythmias are problems with the speed or rhythm of the heartbeat. The monitor is a small, portable device. You can wear one while you do your normal daily activities. This is usually used to diagnose what is causing palpitations/syncope (passing out).You will wear this device for 14 days    Follow-Up: At [facility], you and your health needs are our priority.  As part of our continuing mission to provide you with exceptional heart care, we have created designated Provider Care Teams.  These Care Teams include your primary Cardiologist (physician) and Advanced Practice Providers (APPs -  Physician Assistants and Nurse Practitioners) who all work together to provide you with the care you need, when you  need it.  Your next appointment:   3 month(s)  The format for your next appointment:   In Person  Provider:   Jyl Heinz, MD  Other Instructions  Echocardiogram An echocardiogram is a procedure that uses painless sound waves (ultrasound) to produce an image of the heart. Images from an echocardiogram can provide important information about:  Signs of coronary artery disease (CAD).  Aneurysm detection. An aneurysm is a weak or damaged part of an artery wall that bulges out from the normal force of blood pumping through the body.  Heart size and shape. Changes in the size or shape of the heart can be associated with certain conditions, including heart failure, aneurysm, and CAD.  Heart muscle function.  Heart valve function.  Signs of a past heart attack.  Fluid buildup around the heart.  Thickening of the heart muscle.  A tumor or infectious growth around the heart valves. Tell a health care provider about:  Any allergies you have.  All medicines you are taking, including vitamins, herbs, eye drops, creams, and over-the-counter medicines.  Any blood disorders you have.  Any surgeries you have had.  Any medical conditions you have.  Whether you are pregnant or may be pregnant. What are the risks? Generally, this is a safe procedure. However, problems may occur, including:  Allergic reaction to dye (contrast) that may be used during the procedure. What happens before the procedure? No specific preparation is needed. You may eat and drink normally. What happens during the procedure?   An IV tube may be inserted into one of your veins.  You may receive contrast  through this tube. A contrast is an injection that improves the quality of the pictures from your heart.  A gel will be applied to your chest.  A wand-like tool (transducer) will be moved over your chest. The gel will help to transmit the sound waves from the transducer.  The sound waves will  harmlessly bounce off of your heart to allow the heart images to be captured in real-time motion. The images will be recorded on a computer. The procedure may vary among health care providers and hospitals. What happens after the procedure?  You may return to your normal, everyday life, including diet, activities, and medicines, unless your health care provider tells you not to do that. Summary  An echocardiogram is a procedure that uses painless sound waves (ultrasound) to produce an image of the heart.  Images from an echocardiogram can provide important information about the size and shape of your heart, heart muscle function, heart valve function, and fluid buildup around your heart.  You do not need to do anything to prepare before this procedure. You may eat and drink normally.  After the echocardiogram is completed, you may return to your normal, everyday life, unless your health care provider tells you not to do that. This information is not intended to replace advice given to you by your health care provider. Make sure you discuss any questions you have with your health care provider. Document Revised: 11/21/2018 Document Reviewed: 09/02/2016 Elsevier Patient Education  [place].

## 2019-09-05 NOTE — Progress Notes (Signed)
Cardiology Office Note:    Date:  09/05/2019   ID:  Solash Guist, DOB August 18, 1965, MRN [id number]  PCP:  Shelda Pal, DO  Cardiologist:  Jenean Lindau, MD   Referring MD: Shelda Pal*    ASSESSMENT:    1. Palpitations   2. Cardiac murmur    PLAN:    In order of problems listed above:  1. Palpitations: They appear benign.  Her TSH is fine.  She has good exercise tolerance.  She denies any chest pain orthopnea or PND.  At the time of my evaluation, the patient is alert awake oriented and in no distress.  In view of the above she will get a 2-week ZIO monitoring. 2. Mixed dyslipidemia: I reviewed lipids with her.  Diet was discussed lifestyle modification was encouraged and weight loss was also discussed.Patient will be seen in follow-up appointment in 3 months or earlier if the patient has any concerns    Medication Adjustments/Labs and Tests Ordered: Current medicines are reviewed at length with the patient today.  Concerns regarding medicines are outlined above.  No orders of the defined types were placed in this encounter.  No orders of the defined types were placed in this encounter.    History of Present Illness:    Sharon Bradford is a 54 y.o. female who is being seen today for the evaluation of palpitations at the request of Shelda Pal*.  Patient is a pleasant 54 year old female.  She has past medical history of mixed dyslipidemia.  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  She denies any chest pain orthopnea or PND.  She mentions to me that she walks 2 miles a day without much of a problem.  No orthopnea or PND.  She occasionally has palpitations and these are increasing and that concerns her.  Recent thyroid testing by her primary care physician was unremarkable.  She denies any dizziness or syncope.  At the time of my evaluation, the patient is alert awake oriented and in no  distress.  Past Medical History:  Diagnosis Date  . Allergy   . Arthritis   . Depression   . Headache   . Hyperlipidemia    little elevated but no medicines   . Thyroid disease     Past Surgical History:  Procedure Laterality Date  . APPENDECTOMY    . WISDOM TOOTH EXTRACTION     no sedation     Current Medications: Current Meds  Medication Sig  . Cholecalciferol (VITAMIN D3) 2000 units TABS Take 2,000 Units by mouth daily.  Marland Kitchen levothyroxine (SYNTHROID) 150 MCG tablet Take 1 tablet (150 mcg total) by mouth daily before breakfast.  . meloxicam (MOBIC) 15 MG tablet Take 0.5-1 tablets (7.5-15 mg total) by mouth daily.     Allergies:   Codeine   Social History   Socioeconomic History  . Marital status: Divorced    Spouse name: Not on file  . Number of children: Not on file  . Years of education: Not on file  . Highest education level: Not on file  Occupational History  . Not on file  Tobacco Use  . Smoking status: Former Research scientist (life sciences)  . Smokeless tobacco: Never Used  Substance and Sexual Activity  . Alcohol use: Yes    Comment: rare- 1-2 a year   . Drug use: Never  . Sexual activity: Not on file  Other Topics Concern  . Not on file  Social History Narrative  . Not on file   Social Determinants of Health   Financial Resource Strain:   . Difficulty of Paying Living Expenses: Not on file  Food Insecurity:   . Worried About Charity fundraiser in the Last Year: Not on file  . Ran Out of Food in the Last Year: Not on file  Transportation Needs:   . Lack of Transportation (Medical): Not on file  . Lack of Transportation (Non-Medical): Not on file  Physical Activity:   . Days of Exercise per Week: Not on file  . Minutes of Exercise per Session: Not on file  Stress:   . Feeling of Stress : Not on file  Social Connections:   . Frequency of Communication with Friends and Family: Not on file  . Frequency of Social Gatherings with Friends and Family: Not on file  .  Attends Religious Services: Not on file  . Active Member of Clubs or Organizations: Not on file  . Attends Archivist Meetings: Not on file  . Marital Status: Not on file     Family History: The patient's family history includes Alcohol abuse in her maternal grandfather; Arthritis in her father and paternal grandmother; Atrial fibrillation in her brother; Breast cancer in her mother; Cancer in her father, maternal grandmother, and mother; Colon polyps in her father; Depression in her daughter; Diabetes in her father and paternal grandfather; Drug abuse in her daughter; Heart disease in her paternal grandfather and paternal grandmother; Hyperlipidemia in her father and paternal grandfather; Hypertension in her father, maternal grandmother, mother, paternal grandfather, and paternal grandmother; Lung cancer in her father; Rectal cancer in her paternal aunt. There is no history of Colon cancer, Esophageal cancer, or Stomach cancer.  ROS:   Please see the history of present illness.    All other systems reviewed and are negative.  EKGs/Labs/Other Studies Reviewed:    The following studies were reviewed today: EKG reveals sinus rhythm and nonspecific ST-T changes   Recent Labs: 05/27/2019: ALT 16; BUN 12; Creatinine, Ser 0.97; Hemoglobin 12.3; Platelets 205.0; Potassium 4.6; Sodium 136; TSH 1.64  Recent Lipid Panel    Component Value Date/Time   CHOL 211 (H) 05/27/2019 1425   TRIG 239.0 (H) 05/27/2019 1425   HDL 49.70 05/27/2019 1425   CHOLHDL 4 05/27/2019 1425   VLDL 47.8 (H) 05/27/2019 1425   LDLDIRECT 105.0 05/27/2019 1425    Physical Exam:    VS:  BP 126/78   Pulse 67   Ht 5\' 8"  (1.727 m)   Wt 195 lb (88.5 kg)   SpO2 99%   BMI 29.65 kg/m     Wt Readings from Last 3 Encounters:  09/05/19 195 lb (88.5 kg)  05/27/19 196 lb (88.9 kg)  09/04/18 195 lb 8 oz (88.7 kg)     GEN: Patient is in no acute distress HEENT: Normal NECK: No JVD; No carotid  bruits LYMPHATICS: No lymphadenopathy CARDIAC: S1 S2 regular, 2/6 systolic murmur at the apex. RESPIRATORY:  Clear to auscultation without rales, wheezing or rhonchi  ABDOMEN: Soft, non-tender, non-distended MUSCULOSKELETAL:  No edema; No deformity  SKIN: Warm and dry NEUROLOGIC:  Alert and oriented x 3 PSYCHIATRIC:  Normal affect    Signed, Jenean Lindau, MD  09/05/2019 10:51 AM    [facility]

## 2019-09-22 ENCOUNTER — Other Ambulatory Visit: Payer: Self-pay

## 2019-09-22 ENCOUNTER — Ambulatory Visit (HOSPITAL_BASED_OUTPATIENT_CLINIC_OR_DEPARTMENT_OTHER)
Admission: RE | Admit: 2019-09-22 | Discharge: 2019-09-22 | Disposition: A | Discharge status: Home / Self Care | Payer: No Typology Code available for payment source | Source: Ambulatory Visit | Attending: Cardiology | Admitting: Cardiology

## 2019-09-22 DIAGNOSIS — R002 Palpitations: Secondary | ICD-10-CM | POA: Diagnosis present

## 2019-09-22 DIAGNOSIS — R011 Cardiac murmur, unspecified: Secondary | ICD-10-CM | POA: Diagnosis present

## 2019-09-22 NOTE — Progress Notes (Signed)
  Echocardiogram 2D Echocardiogram has been performed.  Sharon Bradford 09/22/2019, 12:14 PM

## 2019-09-24 NOTE — Telephone Encounter (Signed)
-----   Message from Jenean Lindau, MD sent at 09/22/2019  1:48 PM EST ----- The results of the study is unremarkable. Please inform patient. I will discuss in detail at next appointment. Cc  primary care/referring physician Jenean Lindau, MD 09/22/2019 1:48 PM

## 2019-10-13 ENCOUNTER — Encounter: Payer: Self-pay | Admitting: Family Medicine

## 2019-10-13 ENCOUNTER — Ambulatory Visit (HOSPITAL_BASED_OUTPATIENT_CLINIC_OR_DEPARTMENT_OTHER)
Admission: RE | Admit: 2019-10-13 | Discharge: 2019-10-13 | Disposition: A | Discharge status: Home / Self Care | Payer: No Typology Code available for payment source | Source: Ambulatory Visit | Attending: Family Medicine | Admitting: Family Medicine

## 2019-10-13 ENCOUNTER — Other Ambulatory Visit: Payer: Self-pay

## 2019-10-13 ENCOUNTER — Ambulatory Visit (INDEPENDENT_AMBULATORY_CARE_PROVIDER_SITE_OTHER): Payer: No Typology Code available for payment source | Admitting: Family Medicine

## 2019-10-13 VITALS — BP 136/83 | HR 63 | Ht 67.0 in | Wt 195.0 lb

## 2019-10-13 DIAGNOSIS — Z1231 Encounter for screening mammogram for malignant neoplasm of breast: Secondary | ICD-10-CM | POA: Diagnosis present

## 2019-10-13 DIAGNOSIS — Z1151 Encounter for screening for human papillomavirus (HPV): Secondary | ICD-10-CM | POA: Diagnosis not present

## 2019-10-13 DIAGNOSIS — Z01419 Encounter for gynecological examination (general) (routine) without abnormal findings: Secondary | ICD-10-CM | POA: Diagnosis not present

## 2019-10-13 DIAGNOSIS — Z124 Encounter for screening for malignant neoplasm of cervix: Secondary | ICD-10-CM

## 2019-10-13 NOTE — Patient Instructions (Signed)

## 2019-10-13 NOTE — Progress Notes (Signed)
GYNECOLOGY ANNUAL PREVENTATIVE CARE ENCOUNTER NOTE  Subjective:   Sharon Bradford is a 54 y.o. [phone] female here for a routine annual gynecologic exam.  Current complaints: none.   Denies abnormal vaginal bleeding, discharge, pelvic pain, problems with intercourse or other gynecologic concerns.    Still having periods. Every 3-4 weeks. Light Extensive history of breast cancer in family. Has been tested for genetic causes - testing was negative.  Gynecologic History No LMP recorded. (Menstrual status: Other). Last Pap: 2018. Results were: normal Last mammogram: 2018. Results were: normal  Obstetric History OB History  Gravida Para Term Preterm AB Living  4 2 2  0 2 2  SAB TAB Ectopic Multiple Live Births  2 0 0 0 2    # Outcome Date GA Lbr Len/2nd Weight Sex Delivery Anes PTL Lv  4 Term      Vag-Spont     3 Term      Vag-Spont     2 SAB           1 SAB             Past Medical History:  Diagnosis Date  . Allergy   . Arthritis   . Depression   . Headache   . Hyperlipidemia    little elevated but no medicines   . Thyroid disease   . Vaginal Pap smear, abnormal    colposcopy done    Past Surgical History:  Procedure Laterality Date  . APPENDECTOMY    . WISDOM TOOTH EXTRACTION     no sedation     Current Outpatient Medications on File Prior to Visit  Medication Sig Dispense Refill  . Cholecalciferol (VITAMIN D3) 2000 units TABS Take 2,000 Units by mouth daily.    Marland Kitchen levothyroxine (SYNTHROID) 150 MCG tablet Take 1 tablet (150 mcg total) by mouth daily before breakfast. 90 tablet 3  . meloxicam (MOBIC) 15 MG tablet Take 0.5-1 tablets (7.5-15 mg total) by mouth daily. 90 tablet 3   No current facility-administered medications on file prior to visit.    Allergies  Allergen Reactions  . Codeine Itching    Social History   Socioeconomic History  . Marital status: Divorced    Spouse name: Not on file  . Number of children: Not on file  . Years of  education: Not on file  . Highest education level: Not on file  Occupational History  . Not on file  Tobacco Use  . Smoking status: Former Research scientist (life sciences)  . Smokeless tobacco: Never Used  Substance and Sexual Activity  . Alcohol use: Yes    Comment: rare- 1-2 a year   . Drug use: Never  . Sexual activity: Not Currently  Other Topics Concern  . Not on file  Social History Narrative  . Not on file   Social Determinants of Health   Financial Resource Strain:   . Difficulty of Paying Living Expenses: Not on file  Food Insecurity:   . Worried About Charity fundraiser in the Last Year: Not on file  . Ran Out of Food in the Last Year: Not on file  Transportation Needs:   . Lack of Transportation (Medical): Not on file  . Lack of Transportation (Non-Medical): Not on file  Physical Activity:   . Days of Exercise per Week: Not on file  . Minutes of Exercise per Session: Not on file  Stress:   . Feeling of Stress : Not on file  Social Connections:   .  Frequency of Communication with Friends and Family: Not on file  . Frequency of Social Gatherings with Friends and Family: Not on file  . Attends Religious Services: Not on file  . Active Member of Clubs or Organizations: Not on file  . Attends Archivist Meetings: Not on file  . Marital Status: Not on file  Intimate Partner Violence:   . Fear of Current or Ex-Partner: Not on file  . Emotionally Abused: Not on file  . Physically Abused: Not on file  . Sexually Abused: Not on file    Family History  Problem Relation Age of Onset  . Cancer Mother   . Hypertension Mother   . Breast cancer Mother   . Arthritis Father   . Cancer Father   . Diabetes Father   . Hyperlipidemia Father   . Hypertension Father   . Colon polyps Father   . Lung cancer Father   . Atrial fibrillation Brother   . Depression Daughter   . Drug abuse Daughter   . Cancer Maternal Grandmother   . Hypertension Maternal Grandmother   . Alcohol abuse  Maternal Grandfather   . Arthritis Paternal Grandmother   . Heart disease Paternal Grandmother   . Hypertension Paternal Grandmother   . Diabetes Paternal Grandfather   . Heart disease Paternal Grandfather   . Hyperlipidemia Paternal Grandfather   . Hypertension Paternal Grandfather   . Rectal cancer Paternal Aunt   . Colon cancer Neg Hx   . Esophageal cancer Neg Hx   . Stomach cancer Neg Hx     The following portions of the patient's history were reviewed and updated as appropriate: allergies, current medications, past family history, past medical history, past social history, past surgical history and problem list.  Review of Systems Pertinent items are noted in HPI.   Objective:  BP 136/83   Pulse 63   Ht 5\' 7"  (1.702 m)   Wt 195 lb (88.5 kg)   BMI 30.54 kg/m  Wt Readings from Last 3 Encounters:  10/13/19 195 lb (88.5 kg)  09/05/19 195 lb (88.5 kg)  05/27/19 196 lb (88.9 kg)     Chaperone present during exam  CONSTITUTIONAL: Well-developed, well-nourished female in no acute distress.  HENT:  Normocephalic, atraumatic, External right and left ear normal. Oropharynx is clear and moist EYES: Conjunctivae and EOM are normal. Pupils are equal, round, and reactive to light. No scleral icterus.  NECK: Normal range of motion, supple, no masses.  Normal thyroid.   CARDIOVASCULAR: Normal heart rate noted, regular rhythm RESPIRATORY: Clear to auscultation bilaterally. Effort and breath sounds normal, no problems with respiration noted. BREASTS: Symmetric in size. No masses, skin changes, nipple drainage, or lymphadenopathy. ABDOMEN: Soft, normal bowel sounds, no distention noted.  No tenderness, rebound or guarding.  PELVIC: Normal appearing external genitalia; normal appearing vaginal mucosa and cervix.  No abnormal discharge noted.  Normal uterine size, no other palpable masses, no uterine or adnexal tenderness. MUSCULOSKELETAL: Normal range of motion. No tenderness.  No  cyanosis, clubbing, or edema.  2+ distal pulses. SKIN: Skin is warm and dry. No rash noted. Not diaphoretic. No erythema. No pallor. NEUROLOGIC: Alert and oriented to person, place, and time. Normal reflexes, muscle tone coordination. No cranial nerve deficit noted. PSYCHIATRIC: Normal mood and affect. Normal behavior. Normal judgment and thought content.  Assessment:  Annual gynecologic examination with pap smear   Plan:  1. Well Woman Exam Will follow up results of pap smear and manage accordingly. Mammogram  scheduled  Routine preventative health maintenance measures emphasized. Please refer to After Visit Summary for other counseling recommendations.    Loma Boston, [place] for [employer]

## 2019-10-16 LAB — CYTOLOGY - PAP
Adequacy: ABSENT
Comment: NEGATIVE
Diagnosis: NEGATIVE
Diagnosis: REACTIVE
High risk HPV: NEGATIVE

## 2019-11-21 ENCOUNTER — Ambulatory Visit: Payer: No Typology Code available for payment source | Admitting: Cardiology

## 2019-11-26 ENCOUNTER — Other Ambulatory Visit: Payer: Self-pay | Admitting: Family Medicine

## 2019-11-26 DIAGNOSIS — E785 Hyperlipidemia, unspecified: Secondary | ICD-10-CM

## 2019-12-04 ENCOUNTER — Other Ambulatory Visit (INDEPENDENT_AMBULATORY_CARE_PROVIDER_SITE_OTHER): Payer: No Typology Code available for payment source

## 2019-12-04 ENCOUNTER — Other Ambulatory Visit: Payer: Self-pay

## 2019-12-04 DIAGNOSIS — E785 Hyperlipidemia, unspecified: Secondary | ICD-10-CM | POA: Diagnosis not present

## 2019-12-04 LAB — LIPID PANEL
Cholesterol: 158 mg/dL (ref 0–200)
HDL: 44.7 mg/dL (ref >39.00)
LDL Cholesterol: 89 mg/dL (ref 0–99)
NonHDL: 113.19
Total CHOL/HDL Ratio: 4
Triglycerides: 119 mg/dL (ref 0.0–149.0)
VLDL: 23.8 mg/dL (ref 0.0–40.0)

## 2019-12-18 ENCOUNTER — Telehealth (INDEPENDENT_AMBULATORY_CARE_PROVIDER_SITE_OTHER): Payer: No Typology Code available for payment source | Admitting: Family Medicine

## 2019-12-18 ENCOUNTER — Other Ambulatory Visit: Payer: Self-pay

## 2019-12-18 ENCOUNTER — Ambulatory Visit (HOSPITAL_BASED_OUTPATIENT_CLINIC_OR_DEPARTMENT_OTHER)
Admission: RE | Admit: 2019-12-18 | Discharge: 2019-12-18 | Disposition: A | Discharge status: Home / Self Care | Payer: No Typology Code available for payment source | Source: Ambulatory Visit | Attending: Family Medicine | Admitting: Family Medicine

## 2019-12-18 ENCOUNTER — Encounter: Payer: Self-pay | Admitting: Family Medicine

## 2019-12-18 ENCOUNTER — Other Ambulatory Visit: Payer: Self-pay | Admitting: Family Medicine

## 2019-12-18 VITALS — BP 144/87 | HR 81 | Temp 95.7°F | Resp 16 | Ht 67.0 in | Wt 183.2 lb

## 2019-12-18 DIAGNOSIS — R1031 Right lower quadrant pain: Secondary | ICD-10-CM | POA: Insufficient documentation

## 2019-12-18 DIAGNOSIS — R319 Hematuria, unspecified: Secondary | ICD-10-CM

## 2019-12-18 DIAGNOSIS — R109 Unspecified abdominal pain: Secondary | ICD-10-CM | POA: Diagnosis not present

## 2019-12-18 LAB — CBC
HCT: 37 % (ref 36.0–46.0)
Hemoglobin: 12.5 g/dL (ref 12.0–15.0)
MCHC: 33.6 g/dL (ref 30.0–36.0)
MCV: 88.3 fl (ref 78.0–100.0)
Platelets: 174 10*3/uL (ref 150.0–400.0)
RBC: 4.19 Mil/uL (ref 3.87–5.11)
RDW: 13.1 % (ref 11.5–15.5)
WBC: 5.9 10*3/uL (ref 4.0–10.5)

## 2019-12-18 LAB — URINALYSIS, MICROSCOPIC ONLY
RBC / HPF: NONE SEEN (ref 0–?)
WBC, UA: NONE SEEN (ref 0–?)

## 2019-12-18 LAB — POCT URINALYSIS DIP (MANUAL ENTRY)
Bilirubin, UA: NEGATIVE
Glucose, UA: NEGATIVE mg/dL
Ketones, POC UA: NEGATIVE mg/dL
Leukocytes, UA: NEGATIVE
Nitrite, UA: NEGATIVE
Protein: NEGATIVE
Spec Grav, UA: 1.015 (ref 1.010–1.025)
Urobilinogen, UA: 0.2 E.U./dL
pH, UA: 6 (ref 5.0–8.0)

## 2019-12-18 MED ORDER — METHOCARBAMOL 500 MG PO TABS: 500.0000 mg | ORAL_TABLET | Freq: Three times a day (TID) | ORAL | 1 refills | Status: AC | PRN

## 2019-12-18 MED ORDER — TRAMADOL HCL 50 MG PO TABS: 50.0000 mg | ORAL_TABLET | Freq: Three times a day (TID) | ORAL | 0 refills | Status: AC | PRN

## 2019-12-18 NOTE — Progress Notes (Addendum)
[facility] [place], [place], [place] [address] [phone]  Date:  12/18/2019   Name:  Sharon Bradford   DOB:  22-Jan-1966   MRN:  [id number]  PCP:  Shelda Pal, DO    Chief Complaint: Flank Pain   History of Present Illness:  Sharon Bradford is a 54 y.o. very pleasant female patient who presents with the following:  Primary patient of Dr. Nani Ravens, history of hypothyroidism, obesity, mild hyperlipidemia Virtual visit today for pt with concern of right flank pain  Pt location is home, provider is at office Pt ID confirmed with 2 factors, she gives consent for virtual visit today The pt and myself are present on the call today   Converted to in person visit today due to nature of complaint -patient came in the office to be seen  Today Margarita Grizzle notes intermittent right flank pain for about one month Will come and go, but getting worse She tried doing yoga to see if this might help-however she had too much pain to do her typical yoga practice  It has woken her up from sleep at night.  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  Palpitations 09/05/2019  . Cardiac murmur 09/05/2019  . Hypothyroidism 05/08/2018  . Multiple benign nevi 12/13/2017  . Obesity (BMI 30.0-34.9) 03/20/2017  . Polyuria 03/20/2017  . Vitamin D deficiency 03/20/2017  . History of hyperthyroidism 01/31/2016  . At high risk for breast cancer 01/27/2016  . Major depressive disorder, single episode, moderate ([place]) 01/21/2016    Past Medical History:  Diagnosis Date  . Allergy   . Arthritis   . Depression   . Headache   . Hyperlipidemia    little elevated but no medicines   . Thyroid disease   . Vaginal Pap smear, abnormal    colposcopy done    Past Surgical History:  Procedure Laterality Date  . APPENDECTOMY    . WISDOM TOOTH EXTRACTION     no sedation     Social History   Tobacco Use  . Smoking status: Former Research scientist (life sciences)  . Smokeless tobacco: Never Used  Substance Use Topics  . Alcohol use: Yes    Comment: rare- 1-2 a year   . Drug use: Never    Family History  Problem Relation Age of Onset  . Cancer Mother   . Hypertension Mother   . Breast cancer Mother   . Arthritis Father   . Cancer Father   . Diabetes Father   . Hyperlipidemia Father   .  Hypertension Father   . Colon polyps Father   . Lung cancer Father   . Atrial fibrillation Brother   . Depression Daughter   . Drug abuse Daughter   . Cancer Maternal Grandmother   . Hypertension Maternal Grandmother   . Alcohol abuse Maternal Grandfather   . Arthritis Paternal Grandmother   . Heart disease Paternal Grandmother   . Hypertension Paternal Grandmother   . Diabetes Paternal Grandfather   . Heart disease Paternal Grandfather   . Hyperlipidemia Paternal Grandfather   . Hypertension Paternal Grandfather   . Rectal cancer Paternal Aunt   . Colon cancer Neg Hx   . Esophageal cancer Neg Hx   . Stomach cancer Neg Hx     Allergies  Allergen Reactions  . Codeine Itching    Medication list has been reviewed and updated.  Current Outpatient Medications on  File Prior to Visit  Medication Sig Dispense Refill  . Cholecalciferol (VITAMIN D3) 2000 units TABS Take 2,000 Units by mouth daily.    Marland Kitchen levothyroxine (SYNTHROID) 150 MCG tablet Take 1 tablet (150 mcg total) by mouth daily before breakfast. 90 tablet 3  . meloxicam (MOBIC) 15 MG tablet Take 0.5-1 tablets (7.5-15 mg total) by mouth daily. 90 tablet 3   No current facility-administered medications on file prior to visit.    Review of Systems:  As per HPI- otherwise negative.   Physical Examination: Vitals:   12/18/19 1251  BP: (!) 144/87  Pulse: 81  Resp: 16  Temp: (!) 95.7 F (35.4 C)  SpO2: 97%   Vitals:   12/18/19 1251  Weight: 183 lb 3.2 oz (83.1 kg)  Height: 5\' 7"  (1.702 m)   Body mass index is 28.69 kg/m. Ideal Body Weight: Weight in (lb) to have BMI = 25: 159.3   GEN: no acute distress.  Mild overweight, looks well  HEENT: Atraumatic, Normocephalic.  Ears and Nose: No external deformity. CV: RRR, No M/G/R. No JVD. No thrill. No extra heart sounds. PULM: CTA B, no wheezes, crackles, rhonchi. No retractions. No resp. distress. No accessory muscle use. ABD: S, ND, +BS. No rebound. No HSM.  Pt has mild RLQ tenderness and tenderness of right lower back muscles EXTR: No c/c/e PSYCH: Normally interactive. Conversant.  Pelvic exam normal- no blood in vault, no CMT or adnexal masses    Assessment and Plan: Flank pain - Plan: Urine Culture, POCT urinalysis dipstick, CBC, Comprehensive metabolic panel, POCT urine pregnancy, traMADol (ULTRAM) 50 MG tablet, methocarbamol (ROBAXIN) 500 MG tablet  Hematuria, unspecified type - Plan: Urine Microscopic Only  Right lower quadrant abdominal pain - Plan: CT RENAL STONE STUDY, CANCELED: CT Abdomen Pelvis Wo Contrast, CANCELED: CT Abdomen Pelvis Wo Contrast  Patient here today with concern of right flank pain and right lower quadrant pain.  She does have trace hematuria on dipstick today, would like to rule out nephrolithiasis.  We  will obtain a non- IV contrast CT for her today, otherwise labs pending as above. Will contact pt with results later today  This visit occurred during the SARS-CoV-2 public health emergency.  Safety protocols were in place, including screening questions prior to the visit, additional usage of staff PPE, and extensive cleaning of exam room while observing appropriate contact time as indicated for disinfecting solutions.   Moderate medical decision making today  Signed Lamar Blinks, MD  Received her CT scan, gave patient a call No sign of a kidney stone We will need to do follow-up pancreatic imaging.  I  will check with rads re:MRI vs CT This may be MSK in origin Await the rest of her labs Will refill tramadol for her to use as needed.  Also rx robaxin for muscle spasm- do not use these when driving and do not combine together.  Please contact me if not better or if getting worse- pt states understanding  I do not see HCG resulted but my CMA told me verbally that it was negative.  Will check with her as well   CT RENAL STONE STUDY  Result Date: 12/18/2019 CLINICAL DATA:  Right flank pain, hematuria. EXAM: CT ABDOMEN AND PELVIS WITHOUT CONTRAST TECHNIQUE: Multidetector CT imaging of the abdomen and pelvis was performed following the standard protocol without IV contrast. COMPARISON:  None. FINDINGS: Lower chest: No acute abnormality. Hepatobiliary: No focal liver abnormality is seen. No gallstones, gallbladder wall thickening, or biliary dilatation. Pancreas: Focal prominence of the pancreatic head is noted which measures 3.5 x 2.9 cm. This may simply represent normal parenchyma, but focal inflammation or possibly neoplasm cannot be excluded. Further evaluation with MRI or CT scan with intravenous contrast is recommended. Spleen: Normal in size without focal abnormality. Adrenals/Urinary Tract: Adrenal glands are unremarkable. Kidneys are normal, without renal calculi, focal lesion, or  hydronephrosis. Bladder is unremarkable. Stomach/Bowel: Stomach appears normal. There is no evidence of bowel obstruction or inflammation. Status post appendectomy. Vascular/Lymphatic: No significant vascular findings are present. No enlarged abdominal or pelvic lymph nodes. Reproductive: Uterus and bilateral adnexa are unremarkable. Other: No abdominal wall hernia or abnormality. No abdominopelvic ascites. Musculoskeletal: No acute or significant osseous findings. IMPRESSION: 1. Focal prominence of the pancreatic head is noted which measures 3.5 x 2.9 cm. This may simply represent normal parenchyma, but focal inflammation or possibly neoplasm cannot be excluded. Further evaluation with MRI or CT scan with intravenous contrast is recommended. 2. No other abnormality seen in the abdomen or pelvis. Electronically Signed   By: Marijo Conception M.D.   On: 12/18/2019 14:30   addnd 5/7- received labs as below Spoke with radiology Dr Nyoka Cowden- he recommends ideal test MRI with and without gad to eval pancreatic finding as above  Message to pt   Results for orders placed or performed in visit on 12/18/19  CBC  Result Value Ref Range   WBC 5.9 4.0 - 10.5 K/uL   RBC 4.19 3.87 - 5.11 Mil/uL   Platelets 174.0 150.0 - 400.0 K/uL   Hemoglobin 12.5 12.0 - 15.0 g/dL   HCT 37.0 36.0 - 46.0 %   MCV 88.3 78.0 - 100.0 fl   MCHC 33.6 30.0 - 36.0 g/dL   RDW 13.1 11.5 - 15.5 %  Comprehensive metabolic panel  Result Value Ref Range   Sodium 139 135 - 145 mEq/L   Potassium 4.7 3.5 - 5.1 mEq/L   Chloride 103 96 - 112 mEq/L   CO2 28 19 - 32 mEq/L   Glucose, Bld 115 (H) 70 - 99 mg/dL   BUN 16 6 - 23 mg/dL   Creatinine, Ser 0.91 0.40 - 1.20 mg/dL   Total Bilirubin 0.8 0.2 - 1.2 mg/dL   Alkaline Phosphatase 45 39 - 117 U/L   AST 15 0 - 37 U/L   ALT 13 0 - 35 U/L   Total Protein 6.8 6.0 - 8.3 g/dL   Albumin 4.4 3.5 - 5.2 g/dL   GFR 64.83 >60.00 mL/min   Calcium 9.2 8.4 - 10.5 mg/dL  Urine Microscopic Only  Result  Value Ref Range  WBC, UA none seen 0-2/hpf   RBC / HPF none seen 0-2/hpf   Squamous Epithelial / LPF Rare(0-4/hpf) Rare(0-4/hpf)  POCT urinalysis dipstick  Result Value Ref Range   Color, UA yellow yellow   Clarity, UA clear clear   Glucose, UA negative negative mg/dL   Bilirubin, UA negative negative   Ketones, POC UA negative negative mg/dL   Spec Grav, UA 1.015 1.010 - 1.025   Blood, UA trace-lysed (A) negative   pH, UA 6.0 5.0 - 8.0   Protein negative    Urobilinogen, UA 0.2 0.2 or 1.0 E.U./dL   Nitrite, UA Negative Negative   Leukocytes, UA Negative Negative  POCT urine pregnancy  Result Value Ref Range   Preg Test, Ur Negative Negative

## 2019-12-19 ENCOUNTER — Encounter: Payer: Self-pay | Admitting: Family Medicine

## 2019-12-19 DIAGNOSIS — K862 Cyst of pancreas: Secondary | ICD-10-CM

## 2019-12-19 LAB — COMPREHENSIVE METABOLIC PANEL
ALT: 13 U/L (ref 0–35)
AST: 15 U/L (ref 0–37)
Albumin: 4.4 g/dL (ref 3.5–5.2)
Alkaline Phosphatase: 45 U/L (ref 39–117)
BUN: 16 mg/dL (ref 6–23)
CO2: 28 mEq/L (ref 19–32)
Calcium: 9.2 mg/dL (ref 8.4–10.5)
Chloride: 103 mEq/L (ref 96–112)
Creatinine, Ser: 0.91 mg/dL (ref 0.40–1.20)
GFR: 64.83 mL/min (ref >60.00)
Glucose, Bld: 115 mg/dL — ABNORMAL HIGH (ref 70–99)
Potassium: 4.7 mEq/L (ref 3.5–5.1)
Sodium: 139 mEq/L (ref 135–145)
Total Bilirubin: 0.8 mg/dL (ref 0.2–1.2)
Total Protein: 6.8 g/dL (ref 6.0–8.3)

## 2019-12-19 LAB — URINE CULTURE
MICRO NUMBER:: 10447450
Result:: NO GROWTH
SPECIMEN QUALITY:: ADEQUATE

## 2019-12-19 LAB — POCT URINE PREGNANCY: Preg Test, Ur: NEGATIVE

## 2019-12-20 ENCOUNTER — Emergency Department (INDEPENDENT_AMBULATORY_CARE_PROVIDER_SITE_OTHER): Payer: No Typology Code available for payment source

## 2019-12-20 ENCOUNTER — Other Ambulatory Visit: Payer: Self-pay

## 2019-12-20 ENCOUNTER — Emergency Department (INDEPENDENT_AMBULATORY_CARE_PROVIDER_SITE_OTHER)
Admission: EM | Admit: 2019-12-20 | Discharge: 2019-12-20 | Disposition: A | Discharge status: Home / Self Care | Payer: No Typology Code available for payment source | Source: Home / Self Care | Attending: Family Medicine | Admitting: Family Medicine

## 2019-12-20 DIAGNOSIS — M79661 Pain in right lower leg: Secondary | ICD-10-CM

## 2019-12-20 MED ORDER — ACETAMINOPHEN 500 MG PO TABS
1000.0000 mg | ORAL_TABLET | Freq: Once | ORAL | Status: AC
  Administered 2019-12-20: 11:00:00 1000 mg via ORAL

## 2019-12-20 MED ORDER — PREDNISONE 20 MG PO TABS: ORAL_TABLET | ORAL | 0 refills | Status: AC

## 2019-12-20 NOTE — Discharge Instructions (Addendum)
Apply ice pack for 20 to 30 minutes, 3 to 4 times daily  Continue until pain and swelling decrease.  May take Tylenol as needed for pain.   Stop taking Mobic until after finishing prednisone. Begin stretching exercises as tolerated.  Limit athletic activities for now.

## 2019-12-20 NOTE — ED Triage Notes (Signed)
Patient presents to Urgent Care with complaints of right shin pain since a few days ago. Patient reports she was diagnosed with shin splints earlier this week, feels like she has an inflamed lump on her right shin.

## 2019-12-20 NOTE — ED Provider Notes (Signed)
[facility]    CSN: [id number] Arrival date & time: 12/20/19  1001      History   Chief Complaint Chief Complaint  Patient presents with  . Leg Pain    HPI Sharon Bradford is a 54 y.o. female.   Patient complains of several day history of pain in her right lower anterior tibial area. The pain is worse with ambulation and climbing stairs, and not improved much with meloxicam and tramadol.  She recalls no injury, but admits that she has increased her work out activities during the past month, including step aerobics.  She states that she wears well-fitting athletic shoes.  She has had mild swelling in her lower leg, improved somewhat with support hose which she normally wears every day.  She denies calf pain.     The history is provided by the patient.  Leg Pain Location:  Leg Time since incident:  3 days Injury: no   Leg location:  R lower leg Pain details:    Quality:  Aching   Radiates to:  Does not radiate   Severity:  Moderate   Onset quality:  Gradual   Duration:  3 days   Timing:  Constant   Progression:  Worsening Chronicity:  New Prior injury to area:  No Relieved by:  Nothing Worsened by:  Activity, exercise, bearing weight and extension Ineffective treatments:  NSAIDs Associated symptoms: decreased ROM, swelling and tingling   Associated symptoms: no back pain, no fatigue, no fever, no muscle weakness and no numbness     Past Medical History:  Diagnosis Date  . Allergy   . Arthritis   . Depression   . Headache   . Hyperlipidemia    little elevated but no medicines   . Thyroid disease   . Vaginal Pap smear, abnormal    colposcopy done    Patient Active Problem List   Diagnosis Date Noted  . Palpitations 09/05/2019  . Cardiac murmur 09/05/2019  . Hypothyroidism 05/08/2018  . Multiple benign nevi 12/13/2017  . Obesity (BMI 30.0-34.9) 03/20/2017  . Polyuria 03/20/2017  . Vitamin D deficiency 03/20/2017  . History of  hyperthyroidism 01/31/2016  . At high risk for breast cancer 01/27/2016  . Major depressive disorder, single episode, moderate ([place]) 01/21/2016    Past Surgical History:  Procedure Laterality Date  . APPENDECTOMY    . WISDOM TOOTH EXTRACTION     no sedation     OB History    Gravida  4   Para  2   Term  2   Preterm  0   AB  2   Living  2     SAB  2   TAB  0   Ectopic  0   Multiple  0   Live Births  2            Home Medications    Prior to Admission medications   Medication Sig Start Date End Date Taking? Authorizing Provider  Cholecalciferol (VITAMIN D3) 2000 units TABS Take 2,000 Units by mouth daily.    [provider]  levothyroxine (SYNTHROID) 150 MCG tablet Take 1 tablet (150 mcg total) by mouth daily before breakfast. 08/29/19   Wendling, Crosby Oyster, DO  meloxicam (MOBIC) 15 MG tablet Take 0.5-1 tablets (7.5-15 mg total) by mouth daily. 05/27/19   Wendling, Crosby Oyster, DO  methocarbamol (ROBAXIN) 500 MG tablet Take 1 tablet (500 mg total) by mouth every 8 (eight) hours as needed for muscle  spasms. 12/18/19   Copland, Gay Filler, MD  predniSONE (DELTASONE) 20 MG tablet Take one tab by mouth twice daily for 4 days, then one daily for 3 days. Take with food. 12/20/19   Kandra Nicolas, MD  traMADol (ULTRAM) 50 MG tablet Take 1 tablet (50 mg total) by mouth every 8 (eight) hours as needed for up to 5 days. 12/18/19 12/23/19  Copland, Gay Filler, MD    Family History Family History  Problem Relation Age of Onset  . Cancer Mother   . Hypertension Mother   . Breast cancer Mother   . Arthritis Father   . Cancer Father   . Diabetes Father   . Hyperlipidemia Father   . Hypertension Father   . Colon polyps Father   . Lung cancer Father   . Atrial fibrillation Brother   . Depression Daughter   . Drug abuse Daughter   . Cancer Maternal Grandmother   . Hypertension Maternal Grandmother   . Alcohol abuse Maternal Grandfather   . Arthritis  Paternal Grandmother   . Heart disease Paternal Grandmother   . Hypertension Paternal Grandmother   . Diabetes Paternal Grandfather   . Heart disease Paternal Grandfather   . Hyperlipidemia Paternal Grandfather   . Hypertension Paternal Grandfather   . Rectal cancer Paternal Aunt   . Colon cancer Neg Hx   . Esophageal cancer Neg Hx   . Stomach cancer Neg Hx     Social History Social History   Tobacco Use  . Smoking status: Former Research scientist (life sciences)  . Smokeless tobacco: Never Used  Substance Use Topics  . Alcohol use: Yes    Comment: rare- 1-2 a year   . Drug use: Never     Allergies   Codeine   Review of Systems Review of Systems  Constitutional: Negative for chills, diaphoresis, fatigue and fever.  Musculoskeletal: Negative for back pain.  Skin: Negative for color change and rash.  All other systems reviewed and are negative.    Physical Exam Triage Vital Signs ED Triage Vitals  Enc Vitals Group     BP 12/20/19 1011 127/85     Pulse Rate 12/20/19 1011 65     Resp 12/20/19 1011 16     Temp 12/20/19 1011 98.6 F (37 C)     Temp Source 12/20/19 1011 Oral     SpO2 12/20/19 1011 98 %     Weight --      Height --      Head Circumference --      Peak Flow --      Pain Score 12/20/19 1009 8     Pain Loc --      Pain Edu? --      Excl. in [place]? --    No data found.  Updated Vital Signs BP 127/85 (BP Location: Right Arm)   Pulse 65   Temp 98.6 F (37 C) (Oral)   Resp 16   LMP 12/12/2019   SpO2 98%   Visual Acuity Right Eye Distance:   Left Eye Distance:   Bilateral Distance:    Right Eye Near:   Left Eye Near:    Bilateral Near:     Physical Exam Vitals and nursing note reviewed.  Constitutional:      General: She is not in acute distress. HENT:     Head: Normocephalic.  Eyes:     Pupils: Pupils are equal, round, and reactive to light.  Cardiovascular:     Rate and Rhythm:  Normal rate.  Pulmonary:     Effort: Pulmonary effort is normal.    Musculoskeletal:        General: No swelling.     Right lower leg: Tenderness and bony tenderness present. No swelling. No edema.     Left lower leg: No edema.       Legs:     Comments: There is distinct well-localized tenderness to palpation over the right inferior pre-tibial area as noted on diagram.  Mild tenderness extends to the medial and lateral compartments, worse with resisted dorsiflexion of the right foot, but tenderness is maximal over the tibia itself.  There is no calf tenderness present. Ankle has good range of motion without tenderness  Skin:    General: Skin is warm and dry.     Findings: No rash.  Neurological:     Mental Status: She is alert.      UC Treatments / Results  Labs (all labs ordered are listed, but only abnormal results are displayed) Labs Reviewed - No data to display  EKG   Radiology   Procedures Procedures (including critical care time)  Medications Ordered in UC Medications  acetaminophen (TYLENOL) tablet 1,000 mg (1,000 mg Oral Given 12/20/19 1048)    Initial Impression / Assessment and Plan / UC Course  I have reviewed the triage vital signs and the nursing notes.  Pertinent labs & imaging results that were available during my care of the patient were reviewed by me and considered in my medical decision making (see chart for details).    Suspect shin splints; however, because of the localized point tenderness, must still consider stress fracture.  Recommend follow-up with Sports Medicine.   Because she has not had improvement with Mobic, will begin prednisone burst/taper. Given treatment instructions with range of motion and stretching exercises.    Final Clinical Impressions(s) / UC Diagnoses   Final diagnoses:  Pain in right shin     Discharge Instructions     Apply ice pack for 20 to 30 minutes, 3 to 4 times daily  Continue until pain and swelling decrease.  May take Tylenol as needed for pain.   Stop taking Mobic until  after finishing prednisone. Begin stretching exercises as tolerated.  Limit athletic activities for now.    ED Prescriptions    Medication Sig Dispense Auth. Provider   predniSONE (DELTASONE) 20 MG tablet Take one tab by mouth twice daily for 4 days, then one daily for 3 days. Take with food. 11 tablet Kandra Nicolas, MD        Kandra Nicolas, MD 12/21/19 [phone]

## 2019-12-26 ENCOUNTER — Encounter: Payer: Self-pay | Admitting: Family Medicine

## 2019-12-26 DIAGNOSIS — K8689 Other specified diseases of pancreas: Secondary | ICD-10-CM

## 2019-12-26 DIAGNOSIS — R1084 Generalized abdominal pain: Secondary | ICD-10-CM

## 2020-01-06 ENCOUNTER — Other Ambulatory Visit: Payer: Self-pay

## 2020-01-06 ENCOUNTER — Other Ambulatory Visit (INDEPENDENT_AMBULATORY_CARE_PROVIDER_SITE_OTHER): Payer: No Typology Code available for payment source

## 2020-01-06 DIAGNOSIS — R1084 Generalized abdominal pain: Secondary | ICD-10-CM

## 2020-01-06 DIAGNOSIS — K8689 Other specified diseases of pancreas: Secondary | ICD-10-CM

## 2020-01-06 LAB — C-REACTIVE PROTEIN: CRP: <1 mg/dL (ref 0.5–20.0)

## 2020-01-06 LAB — SEDIMENTATION RATE: Sed Rate: 7 mm/hr (ref 0–30)

## 2020-01-06 LAB — AMYLASE: Amylase: 36 U/L (ref 27–131)

## 2020-01-06 LAB — LIPASE: Lipase: 25 U/L (ref 11.0–59.0)

## 2020-01-08 LAB — ANTI-NUCLEAR AB-TITER (ANA TITER): ANA Titer 1: 1:40 {titer} — ABNORMAL HIGH

## 2020-01-08 LAB — ANA: Anti Nuclear Antibody (ANA): POSITIVE — AB

## 2020-01-12 ENCOUNTER — Encounter: Payer: Self-pay | Admitting: Family Medicine

## 2020-01-12 ENCOUNTER — Ambulatory Visit: Payer: No Typology Code available for payment source

## 2020-01-12 ENCOUNTER — Other Ambulatory Visit: Payer: Self-pay

## 2020-01-12 DIAGNOSIS — K862 Cyst of pancreas: Secondary | ICD-10-CM

## 2020-01-12 MED ORDER — GADOBUTROL 1 MMOL/ML IV SOLN
8.0000 mL | Freq: Once | INTRAVENOUS | Status: AC | PRN
  Administered 2020-01-12: 8 mL via INTRAVENOUS

## 2020-01-16 ENCOUNTER — Other Ambulatory Visit: Payer: No Typology Code available for payment source

## 2021-07-06 ENCOUNTER — Telehealth: Payer: Self-pay

## 2021-07-06 NOTE — Telephone Encounter (Signed)
I have called patient to get her scheduled for her recall colonoscopy. Patient states that she switched jobs so she had another doctor do it because of her insurance.

## 2021-09-27 ENCOUNTER — Encounter: Payer: Self-pay | Admitting: Gastroenterology

## 2021-09-28 IMAGING — MG DIGITAL SCREENING BILAT W/ TOMO W/ CAD
6 of 10 series · 6 of 30 positions shown · non-contrast
Comparison: Previous exam(s).

CLINICAL DATA: Screening.

EXAM:
DIGITAL SCREENING BILATERAL MAMMOGRAM WITH TOMO AND CAD

[R CC synth-2D]
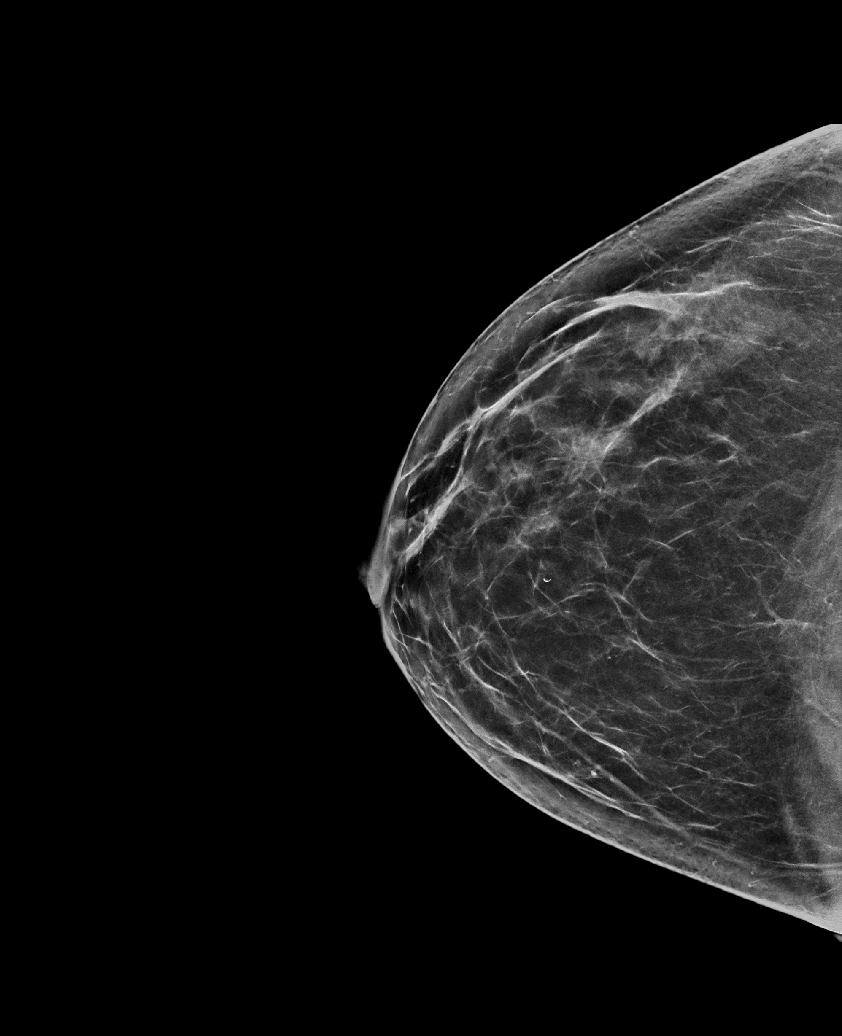

[L MLO synth-2D (1 of 2)]
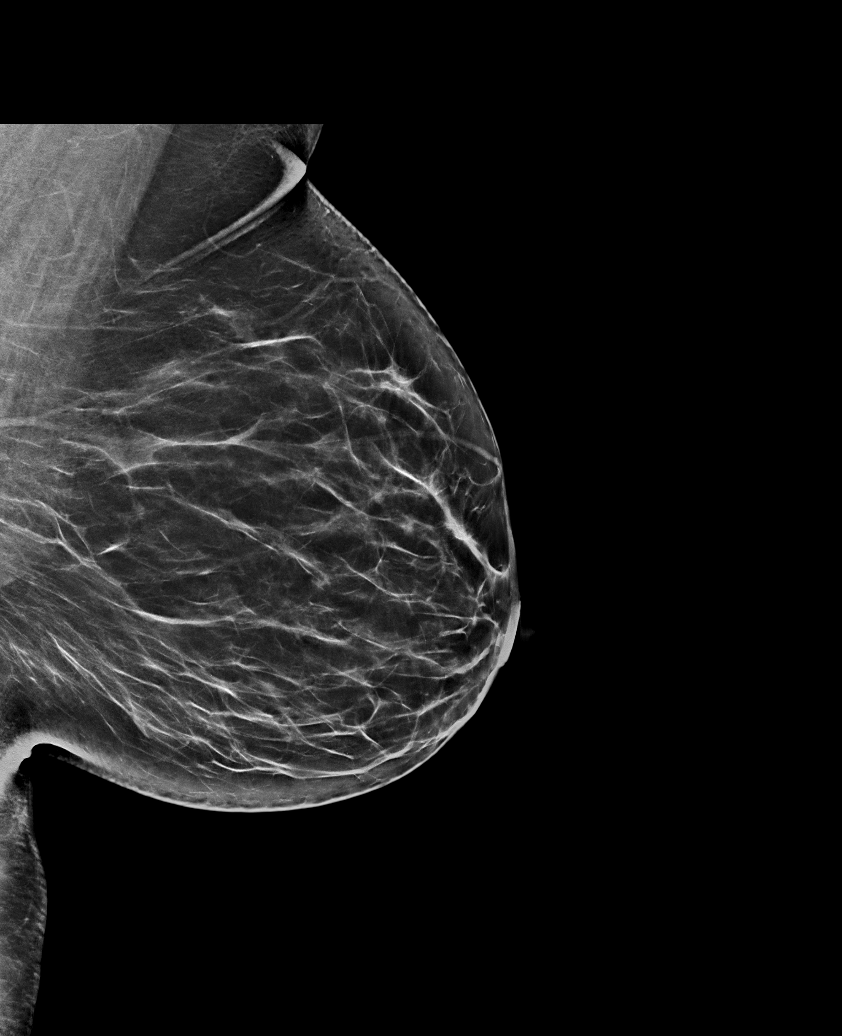

[L MLO synth-2D (2 of 2)]
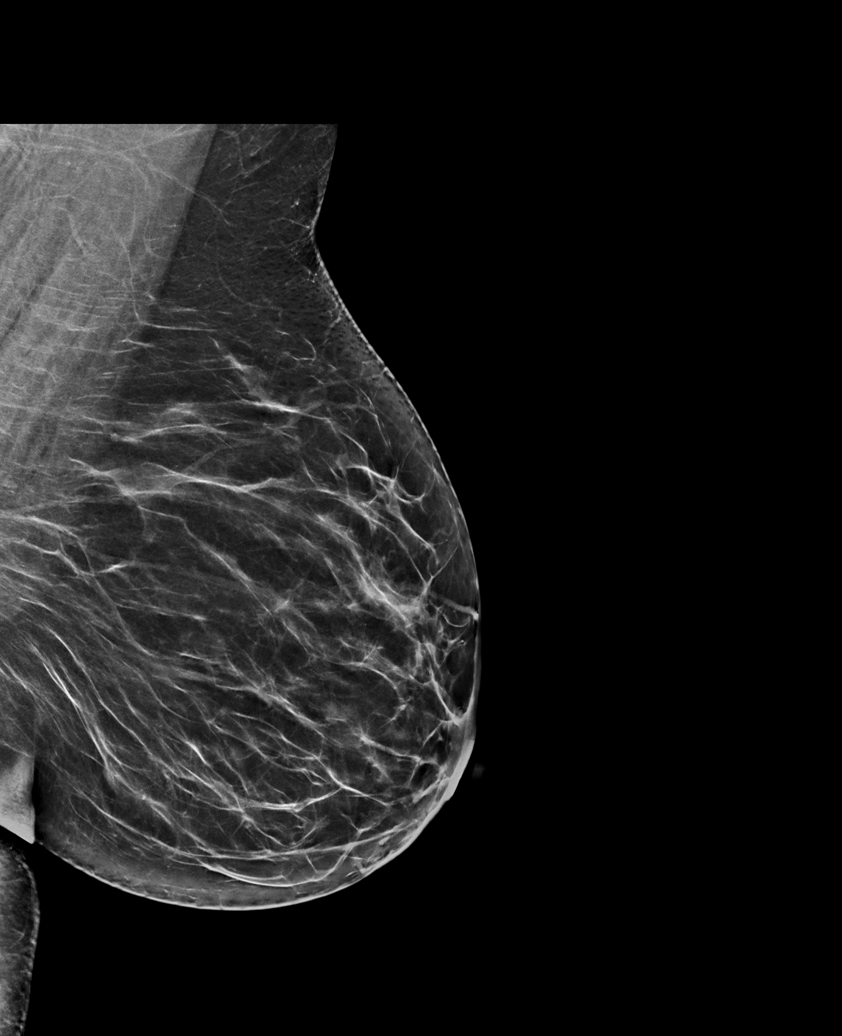

[R MLO synth-2D]
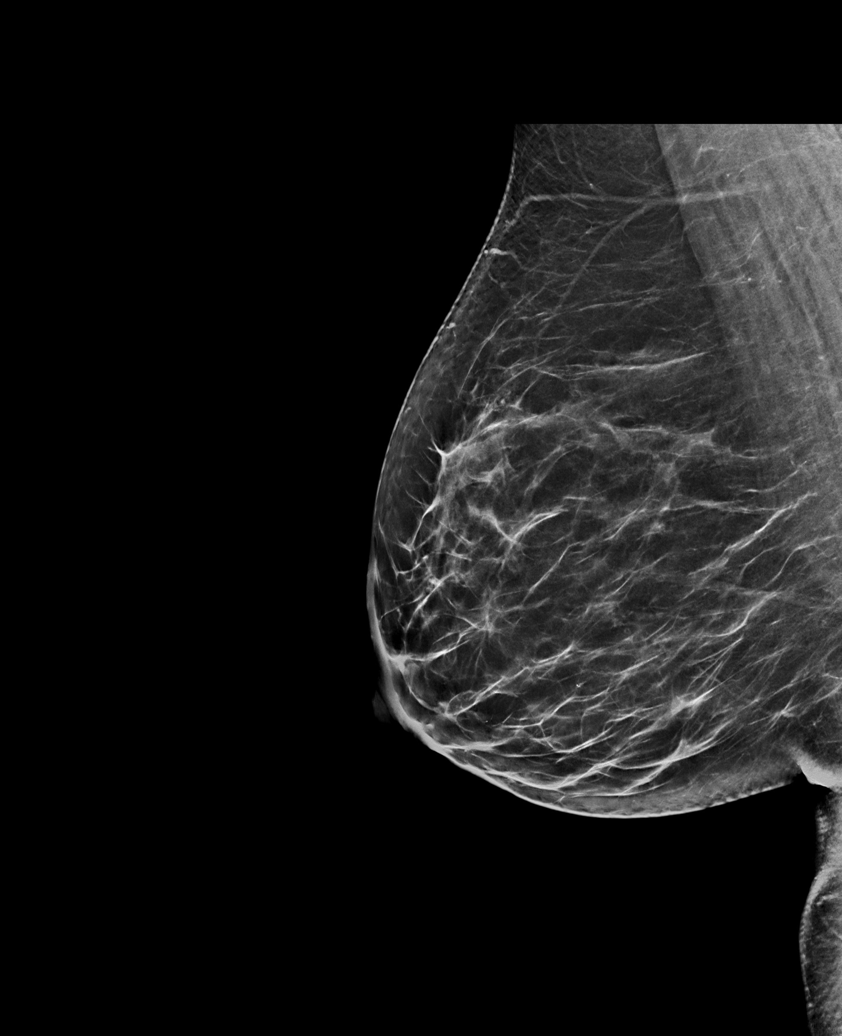

[L CC synth-2D]
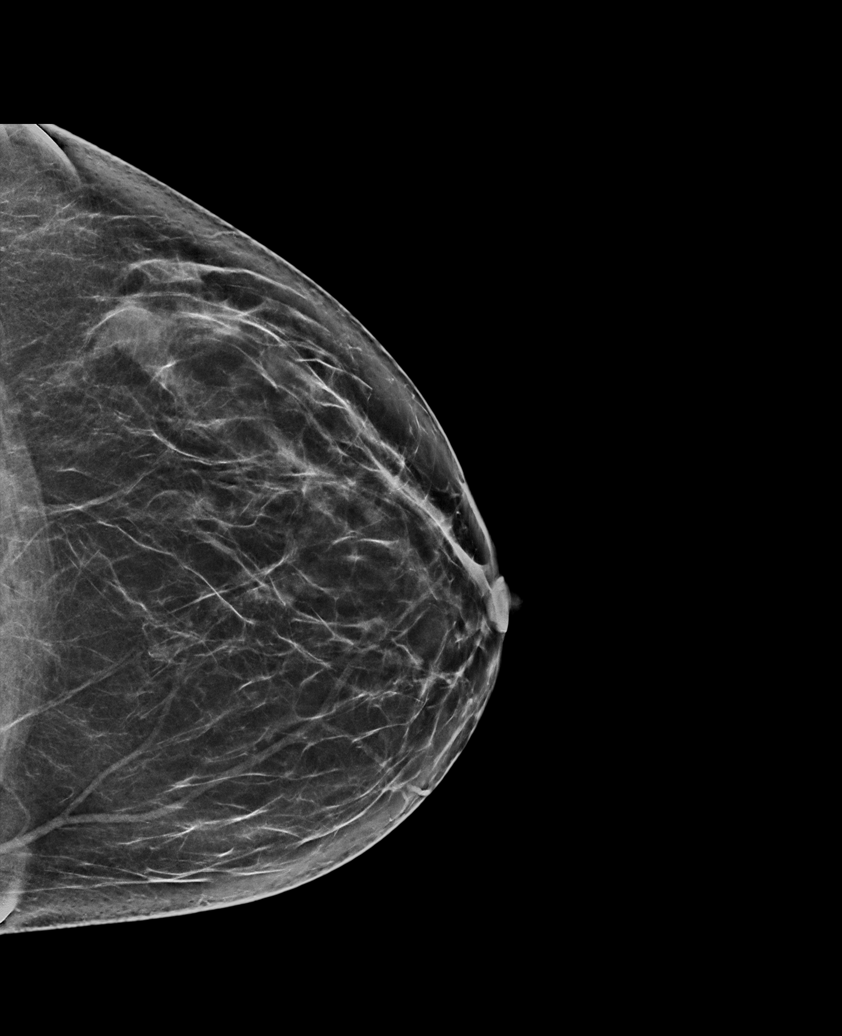

[R MLO tomo · tomo slice 37/74.0]
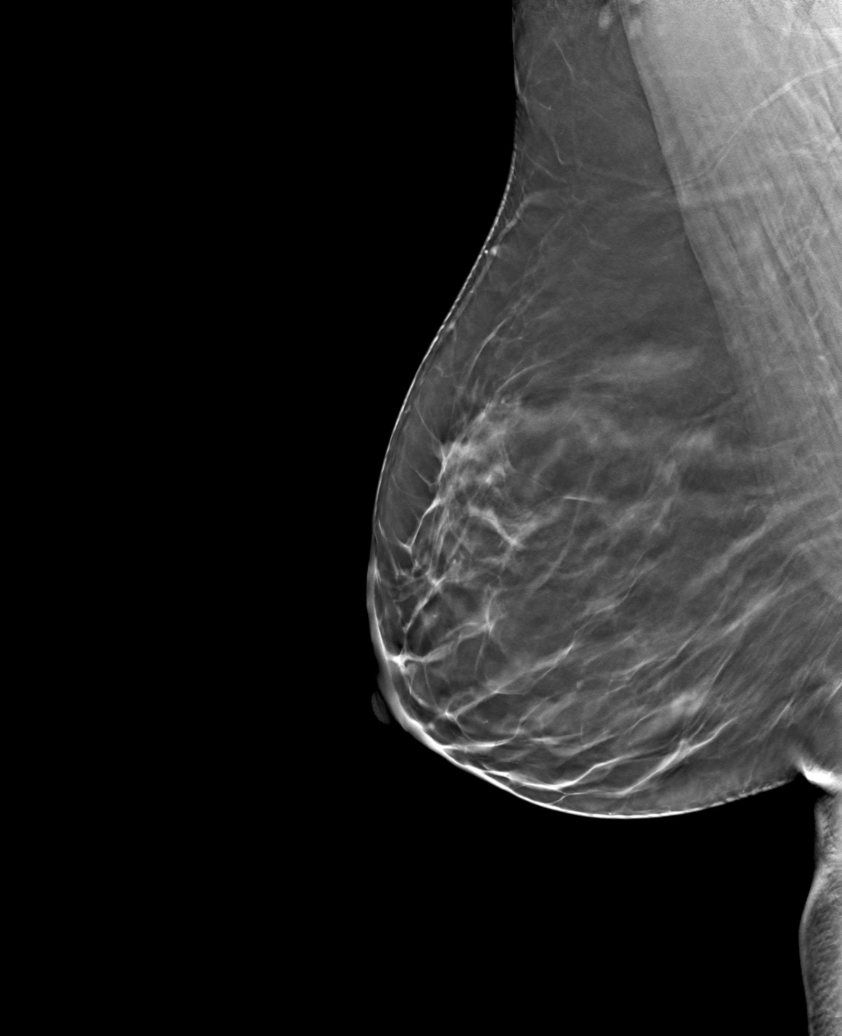

[6 of 30 positions shown; findings below may reference images not displayed]

ACR Breast Density Category b: There are scattered areas of
fibroglandular density.
FINDINGS: There are no findings suspicious for malignancy. Images were
processed with CAD.
IMPRESSION: No mammographic evidence of malignancy. A result letter of this
screening mammogram will be mailed directly to the patient.

RECOMMENDATION:
Screening mammogram in one year. (Code:[id number])

BI-RADS CATEGORY  1: Negative.
